# Patient Record
Sex: Female | Born: 1950
Health system: Southern US, Community
[De-identification: ages and names within clinical notes are randomized; demographics above are authoritative.]

## PROBLEM LIST (undated history)

## (undated) DIAGNOSIS — M8580 Other specified disorders of bone density and structure, unspecified site: Secondary | ICD-10-CM

## (undated) HISTORY — DX: Other specified disorders of bone density and structure, unspecified site: M85.80

## (undated) HISTORY — PX: OTHER SURGICAL HISTORY: SHX169

---

## 1997-12-01 ENCOUNTER — Ambulatory Visit (HOSPITAL_COMMUNITY): Admission: RE | Admit: 1997-12-01 | Discharge: 1997-12-01 | Payer: Self-pay | Admitting: Gynecology

## 1998-08-24 ENCOUNTER — Other Ambulatory Visit: Admission: RE | Admit: 1998-08-24 | Discharge: 1998-08-24 | Payer: Self-pay | Admitting: Gynecology

## 1999-10-02 ENCOUNTER — Other Ambulatory Visit: Admission: RE | Admit: 1999-10-02 | Discharge: 1999-10-02 | Payer: Self-pay | Admitting: Gynecology

## 2000-11-27 ENCOUNTER — Other Ambulatory Visit: Admission: RE | Admit: 2000-11-27 | Discharge: 2000-11-27 | Payer: Self-pay | Admitting: Gynecology

## 2002-01-21 ENCOUNTER — Other Ambulatory Visit: Admission: RE | Admit: 2002-01-21 | Discharge: 2002-01-21 | Payer: Self-pay | Admitting: Gynecology

## 2005-03-21 ENCOUNTER — Other Ambulatory Visit: Admission: RE | Admit: 2005-03-21 | Discharge: 2005-03-21 | Payer: Self-pay | Admitting: Gynecology

## 2005-07-08 ENCOUNTER — Emergency Department (HOSPITAL_COMMUNITY): Admission: EM | Admit: 2005-07-08 | Discharge: 2005-07-08 | Payer: Self-pay | Admitting: Family Medicine

## 2006-09-10 ENCOUNTER — Other Ambulatory Visit: Admission: RE | Admit: 2006-09-10 | Discharge: 2006-09-10 | Payer: Self-pay | Admitting: Gynecology

## 2009-02-22 ENCOUNTER — Other Ambulatory Visit: Admission: RE | Admit: 2009-02-22 | Discharge: 2009-02-22 | Payer: Self-pay | Admitting: Gynecology

## 2009-02-22 ENCOUNTER — Encounter: Payer: Self-pay | Admitting: Gynecology

## 2009-02-22 ENCOUNTER — Ambulatory Visit: Payer: Self-pay | Admitting: Gynecology

## 2009-03-01 ENCOUNTER — Ambulatory Visit: Payer: Self-pay | Admitting: Gynecology

## 2009-08-01 ENCOUNTER — Encounter: Admission: RE | Admit: 2009-08-01 | Discharge: 2009-08-01 | Payer: Self-pay | Admitting: Internal Medicine

## 2011-01-22 ENCOUNTER — Other Ambulatory Visit: Payer: Self-pay | Admitting: Gynecology

## 2011-01-22 ENCOUNTER — Other Ambulatory Visit: Payer: Self-pay

## 2011-01-22 DIAGNOSIS — Z1231 Encounter for screening mammogram for malignant neoplasm of breast: Secondary | ICD-10-CM

## 2011-01-24 ENCOUNTER — Other Ambulatory Visit (HOSPITAL_COMMUNITY)
Admission: RE | Admit: 2011-01-24 | Discharge: 2011-01-24 | Disposition: A | Discharge status: Home / Self Care | Payer: BC Managed Care – PPO | Source: Ambulatory Visit | Attending: Gynecology | Admitting: Gynecology

## 2011-01-24 ENCOUNTER — Other Ambulatory Visit: Payer: Self-pay | Admitting: Gynecology

## 2011-01-24 ENCOUNTER — Encounter (INDEPENDENT_AMBULATORY_CARE_PROVIDER_SITE_OTHER): Payer: BC Managed Care – PPO | Admitting: Gynecology

## 2011-01-24 DIAGNOSIS — R823 Hemoglobinuria: Secondary | ICD-10-CM

## 2011-01-24 DIAGNOSIS — Z833 Family history of diabetes mellitus: Secondary | ICD-10-CM

## 2011-01-24 DIAGNOSIS — Z13220 Encounter for screening for lipoid disorders: Secondary | ICD-10-CM

## 2011-01-24 DIAGNOSIS — Z124 Encounter for screening for malignant neoplasm of cervix: Secondary | ICD-10-CM | POA: Insufficient documentation

## 2011-01-24 DIAGNOSIS — Z01419 Encounter for gynecological examination (general) (routine) without abnormal findings: Secondary | ICD-10-CM

## 2011-01-31 ENCOUNTER — Ambulatory Visit
Admission: RE | Admit: 2011-01-31 | Discharge: 2011-01-31 | Disposition: A | Discharge status: Home / Self Care | Payer: BC Managed Care – PPO | Source: Ambulatory Visit | Attending: Gynecology | Admitting: Gynecology

## 2011-01-31 DIAGNOSIS — Z1231 Encounter for screening mammogram for malignant neoplasm of breast: Secondary | ICD-10-CM

## 2011-02-26 ENCOUNTER — Other Ambulatory Visit: Payer: BC Managed Care – PPO

## 2011-06-06 ENCOUNTER — Encounter: Payer: Self-pay | Admitting: Internal Medicine

## 2011-07-26 ENCOUNTER — Other Ambulatory Visit: Payer: BC Managed Care – PPO | Admitting: Internal Medicine

## 2012-01-10 ENCOUNTER — Other Ambulatory Visit: Payer: Self-pay | Admitting: Gastroenterology

## 2012-11-20 ENCOUNTER — Encounter: Payer: Self-pay | Admitting: Gynecology

## 2012-11-20 ENCOUNTER — Ambulatory Visit (INDEPENDENT_AMBULATORY_CARE_PROVIDER_SITE_OTHER): Payer: BC Managed Care – PPO | Admitting: Gynecology

## 2012-11-20 VITALS — BP 124/76 | Ht 65.5 in | Wt 145.0 lb

## 2012-11-20 DIAGNOSIS — Z01419 Encounter for gynecological examination (general) (routine) without abnormal findings: Secondary | ICD-10-CM

## 2012-11-20 DIAGNOSIS — N951 Menopausal and female climacteric states: Secondary | ICD-10-CM

## 2012-11-20 DIAGNOSIS — M949 Disorder of cartilage, unspecified: Secondary | ICD-10-CM

## 2012-11-20 DIAGNOSIS — M899 Disorder of bone, unspecified: Secondary | ICD-10-CM

## 2012-11-20 DIAGNOSIS — M8580 Other specified disorders of bone density and structure, unspecified site: Secondary | ICD-10-CM | POA: Insufficient documentation

## 2012-11-20 DIAGNOSIS — N952 Postmenopausal atrophic vaginitis: Secondary | ICD-10-CM

## 2012-11-20 NOTE — Progress Notes (Signed)
Loretta Cunningham [birth year] [id number]        62 y.o.  [id number] for annual exam.  Doing well.  Past medical history,surgical history, medications, allergies, family history and social history were all reviewed and documented in the EPIC chart. ROS:  Was performed and pertinent positives and negatives are included in the history.  Exam: Kim assistant Filed Vitals:   11/20/12 1442  BP: 124/76  Height: 5' 5.5" (1.664 m)  Weight: 145 lb (65.772 kg)   General appearance  Normal Skin grossly normal Head/Neck normal with no cervical or supraclavicular adenopathy thyroid normal Lungs  clear Cardiac RR, without RMG Abdominal  soft, nontender, without masses, organomegaly or hernia Breasts  examined lying and sitting without masses, retractions, discharge or axillary adenopathy. Pelvic  Ext/BUS/vagina  normal with atrophic changes  Cervix  normal   Uterus  anteverted, normal size, shape and contour, midline and mobile nontender   Adnexa  Without masses or tenderness    Anus and perineum  normal   Rectovaginal  normal sphincter tone without palpated masses or tenderness.    Assessment/Plan:  62 y.o. [id number] female for annual exam.   1. Postmenopausal. Patient is having some hot flashes but not overly bothersome and does not want to consider treatment for them. No bleeding. She does report any bleeding. 2. Osteopenia. DEXA 2010 with T score -1.1. Recommend repeat now and patient will schedule. Increase calcium and vitamin D reviewed. 3. Maternal history of ovarian cancer age 35. No other family history of ovarian breast or other cancers.  Issues of screening to include CA 125 and ultrasound reviewed. False positive false negative issues discussed. patient will think of her options and follow up if she wants to pursue screening.  Given history do not feel genetic testing or other more aggressive approach such as prophylactic salpingo-oophorectomies warranted it at this time. 4. Mammography 2012.  Patient's overdo and she knows this and agrees to schedule. SBE monthly reviewed. 5. Pap smear 2012. No Pap smear done today. No history of abnormal Pap smears. Reviewed current screening guidelines we'll plan repeat next year a 3 year interval. 6. Colonoscopy 2013. 7. Health maintenance. No blood work done today as is all done through her work and she follows up with her primary physician in reference to this. Follow up for DEXA otherwise annually.    Dara Lords MD, 3:22 PM 11/20/2012

## 2012-11-20 NOTE — Patient Instructions (Signed)
Followup for bone density as scheduled. 

## 2012-11-21 ENCOUNTER — Telehealth: Payer: Self-pay | Admitting: *Deleted

## 2012-11-21 LAB — URINALYSIS W MICROSCOPIC + REFLEX CULTURE
Bacteria, UA: NONE SEEN
Casts: NONE SEEN
Crystals: NONE SEEN
Leukocytes, UA: NEGATIVE
Protein, ur: NEGATIVE mg/dL
Squamous Epithelial / LPF: NONE SEEN
Urobilinogen, UA: 0.2 mg/dL (ref 0.0–1.0)
pH: 5.5 (ref 5.0–8.0)

## 2012-11-21 NOTE — Telephone Encounter (Signed)
Left message regarding the below.

## 2012-11-21 NOTE — Telephone Encounter (Signed)
Message copied by Aura Camps on Fri Nov 21, 2012 10:13 AM ------      Message from: Colin Broach P      Created: Thu Nov 20, 2012  5:01 PM       Call patient I saw her today for a exam. She has a history of ovarian cancer in her mother and we talked about doing an ultrasound or CA 125. I did not formalize whether she really wants to schedule this or not. If she wants to schedule the ultrasound go ahead and schedule it, if she decides against this then that's okay.

## 2012-12-02 NOTE — Telephone Encounter (Signed)
Left message for pt to call regarding the below. 

## 2012-12-17 ENCOUNTER — Encounter: Payer: Self-pay | Admitting: *Deleted

## 2012-12-17 NOTE — Telephone Encounter (Signed)
Letter mailed to pt home regarding the below.

## 2013-01-20 ENCOUNTER — Ambulatory Visit (INDEPENDENT_AMBULATORY_CARE_PROVIDER_SITE_OTHER): Payer: BC Managed Care – PPO

## 2013-01-20 DIAGNOSIS — M899 Disorder of bone, unspecified: Secondary | ICD-10-CM

## 2013-01-20 DIAGNOSIS — M8580 Other specified disorders of bone density and structure, unspecified site: Secondary | ICD-10-CM

## 2013-01-21 ENCOUNTER — Encounter: Payer: Self-pay | Admitting: Gynecology

## 2013-09-01 ENCOUNTER — Other Ambulatory Visit: Payer: Self-pay

## 2013-09-01 DIAGNOSIS — Z1231 Encounter for screening mammogram for malignant neoplasm of breast: Secondary | ICD-10-CM

## 2013-09-04 ENCOUNTER — Ambulatory Visit
Admission: RE | Admit: 2013-09-04 | Discharge: 2013-09-04 | Disposition: A | Discharge status: Home / Self Care | Payer: BC Managed Care – PPO | Source: Ambulatory Visit

## 2013-09-04 DIAGNOSIS — Z1231 Encounter for screening mammogram for malignant neoplasm of breast: Secondary | ICD-10-CM

## 2014-08-23 ENCOUNTER — Encounter: Payer: Self-pay | Admitting: Gynecology

## 2015-01-11 ENCOUNTER — Ambulatory Visit (INDEPENDENT_AMBULATORY_CARE_PROVIDER_SITE_OTHER): Payer: BLUE CROSS/BLUE SHIELD | Admitting: Gynecology

## 2015-01-11 ENCOUNTER — Other Ambulatory Visit (HOSPITAL_COMMUNITY)
Admission: RE | Admit: 2015-01-11 | Discharge: 2015-01-11 | Disposition: A | Discharge status: Home / Self Care | Payer: BLUE CROSS/BLUE SHIELD | Source: Ambulatory Visit | Attending: Gynecology | Admitting: Gynecology

## 2015-01-11 ENCOUNTER — Encounter: Payer: Self-pay | Admitting: Gynecology

## 2015-01-11 VITALS — BP 118/80 | Ht 65.0 in | Wt 144.0 lb

## 2015-01-11 DIAGNOSIS — M8580 Other specified disorders of bone density and structure, unspecified site: Secondary | ICD-10-CM

## 2015-01-11 DIAGNOSIS — Z8041 Family history of malignant neoplasm of ovary: Secondary | ICD-10-CM

## 2015-01-11 DIAGNOSIS — Z01419 Encounter for gynecological examination (general) (routine) without abnormal findings: Secondary | ICD-10-CM | POA: Insufficient documentation

## 2015-01-11 MED ORDER — VARENICLINE TARTRATE 1 MG PO TABS: 1.0000 mg | ORAL_TABLET | Freq: Two times a day (BID) | ORAL | Status: DC

## 2015-01-11 MED ORDER — VARENICLINE TARTRATE 0.5 MG PO TABS: 0.5000 mg | ORAL_TABLET | Freq: Every day | ORAL | Status: DC

## 2015-01-11 NOTE — Patient Instructions (Signed)
Follow up for bone density as scheduled.  Follow up for ultrasound as scheduled.  Call to Schedule your mammogram  Facilities in [place]: 1)  [facility], [place] Phone: [phone] 2)  [facility]. [address] [place] Phone: [phone] 3)  Dr. Isaiah Blakes at [facility] [address] Phone: [phone]     Mammogram A mammogram is an X-ray test to find changes in a woman's breast. You should get a mammogram if:  You are 59 years of age or older  You have risk factors.   Your doctor recommends that you have one.  BEFORE THE TEST  Do not schedule the test the week before your period, especially if your breasts are sore during this time.  On the day of your mammogram:  Wash your breasts and armpits well. After washing, do not put on any deodorant or talcum powder on until after your test.   Eat and drink as you usually do.   Take your medicines as usual.   If you are diabetic and take insulin, make sure you:   Eat before coming for your test.   Take your insulin as usual.   If you cannot keep your appointment, call before the appointment to cancel. Schedule another appointment.  TEST  You will need to undress from the waist up. You will put on a hospital gown.   Your breast will be put on the mammogram machine, and it will press firmly on your breast with a piece of plastic called a compression paddle. This will make your breast flatter so that the machine can X-ray all parts of your breast.   Both breasts will be X-rayed. Each breast will be X-rayed from above and from the side. An X-ray might need to be taken again if the picture is not good enough.   The mammogram will last about 15 to 30 minutes.  AFTER THE TEST Finding out the results of your test Ask when your test results will be ready. Make sure you get your test results.  Document Released:  01/04/2009 Document Revised: 09/27/2011 Document Reviewed: 01/04/2009 [facility] Patient Information 2012 [place].  You may obtain a copy of any labs that were done today by logging onto MyChart as outlined in the instructions provided with your AVS (after visit summary). The office will not call with normal lab results but certainly if there are any significant abnormalities then we will contact you.   Health Maintenance, Female A healthy lifestyle and preventative care can promote health and wellness.  Maintain regular health, dental, and eye exams.  Eat a healthy diet. Foods like vegetables, fruits, whole grains, low-fat dairy products, and lean protein foods contain the nutrients you need without too many calories. Decrease your intake of foods high in solid fats, added sugars, and salt. Get information about a proper diet from your caregiver, if necessary.  Regular physical exercise is one of the most important things you can do for your health. Most adults should get at least 150 minutes of moderate-intensity exercise (any activity that increases your heart rate and causes you to sweat) each week. In addition, most adults need muscle-strengthening exercises on 2 or more days a week.   Maintain a healthy weight. The body mass index (BMI) is a screening tool to identify possible weight problems. It provides an estimate of body fat based on height and weight. Your caregiver can help  determine your BMI, and can help you achieve or maintain a healthy weight. For adults 20 years and older:  A BMI below 18.5 is considered underweight.  A BMI of 18.5 to 24.9 is normal.  A BMI of 25 to 29.9 is considered overweight.  A BMI of 30 and above is considered obese.  Maintain normal blood lipids and cholesterol by exercising and minimizing your intake of saturated fat. Eat a balanced diet with plenty of fruits and vegetables. Blood tests for lipids and cholesterol should begin at age 52 and be  repeated every 5 years. If your lipid or cholesterol levels are high, you are over 50, or you are a high risk for heart disease, you may need your cholesterol levels checked more frequently.Ongoing high lipid and cholesterol levels should be treated with medicines if diet and exercise are not effective.  If you smoke, find out from your caregiver how to quit. If you do not use tobacco, do not start.  Lung cancer screening is recommended for adults aged 30 80 years who are at high risk for developing lung cancer because of a history of smoking. Yearly low-dose computed tomography (CT) is recommended for people who have at least a 30-pack-year history of smoking and are a current smoker or have quit within the past 15 years. A pack year of smoking is smoking an average of 1 pack of cigarettes a day for 1 year (for example: 1 pack a day for 30 years or 2 packs a day for 15 years). Yearly screening should continue until the smoker has stopped smoking for at least 15 years. Yearly screening should also be stopped for people who develop a health problem that would prevent them from having lung cancer treatment.  If you are pregnant, do not drink alcohol. If you are breastfeeding, be very cautious about drinking alcohol. If you are not pregnant and choose to drink alcohol, do not exceed 1 drink per day. One drink is considered to be 12 ounces (355 mL) of beer, 5 ounces (148 mL) of wine, or 1.5 ounces (44 mL) of liquor.  Avoid use of street drugs. Do not share needles with anyone. Ask for help if you need support or instructions about stopping the use of drugs.  High blood pressure causes heart disease and increases the risk of stroke. Blood pressure should be checked at least every 1 to 2 years. Ongoing high blood pressure should be treated with medicines, if weight loss and exercise are not effective.  If you are 23 to 63 years old, ask your caregiver if you should take aspirin to prevent strokes.  Diabetes  screening involves taking a blood sample to check your fasting blood sugar level. This should be done once every 3 years, after age 40, if you are within normal weight and without risk factors for diabetes. Testing should be considered at a younger age or be carried out more frequently if you are overweight and have at least 1 risk factor for diabetes.  Breast cancer screening is essential preventative care for women. You should practice "breast self-awareness." This means understanding the normal appearance and feel of your breasts and may include breast self-examination. Any changes detected, no matter how small, should be reported to a caregiver. Women in their 36s and 30s should have a clinical breast exam (CBE) by a caregiver as part of a regular health exam every 1 to 3 years. After age 49, women should have a CBE every year. Starting at age  4, women should consider having a mammogram (breast X-ray) every year. Women who have a family history of breast cancer should talk to their caregiver about genetic screening. Women at a high risk of breast cancer should talk to their caregiver about having an MRI and a mammogram every year.  Breast cancer gene (BRCA)-related cancer risk assessment is recommended for women who have family members with BRCA-related cancers. BRCA-related cancers include breast, ovarian, tubal, and peritoneal cancers. Having family members with these cancers may be associated with an increased risk for harmful changes (mutations) in the breast cancer genes BRCA1 and BRCA2. Results of the assessment will determine the need for genetic counseling and BRCA1 and BRCA2 testing.  The Pap test is a screening test for cervical cancer. Women should have a Pap test starting at age 59. Between ages 52 and 60, Pap tests should be repeated every 2 years. Beginning at age 17, you should have a Pap test every 3 years as long as the past 3 Pap tests have been normal. If you had a hysterectomy for a  problem that was not cancer or a condition that could lead to cancer, then you no longer need Pap tests. If you are between ages 42 and 36, and you have had normal Pap tests going back 10 years, you no longer need Pap tests. If you have had past treatment for cervical cancer or a condition that could lead to cancer, you need Pap tests and screening for cancer for at least 20 years after your treatment. If Pap tests have been discontinued, risk factors (such as a new sexual partner) need to be reassessed to determine if screening should be resumed. Some women have medical problems that increase the chance of getting cervical cancer. In these cases, your caregiver may recommend more frequent screening and Pap tests.  The human papillomavirus (HPV) test is an additional test that may be used for cervical cancer screening. The HPV test looks for the virus that can cause the cell changes on the cervix. The cells collected during the Pap test can be tested for HPV. The HPV test could be used to screen women aged 44 years and older, and should be used in women of any age who have unclear Pap test results. After the age of 41, women should have HPV testing at the same frequency as a Pap test.  Colorectal cancer can be detected and often prevented. Most routine colorectal cancer screening begins at the age of 35 and continues through age 35. However, your caregiver may recommend screening at an earlier age if you have risk factors for colon cancer. On a yearly basis, your caregiver may provide home test kits to check for hidden blood in the stool. Use of a small camera at the end of a tube, to directly examine the colon (sigmoidoscopy or colonoscopy), can detect the earliest forms of colorectal cancer. Talk to your caregiver about this at age 54, when routine screening begins. Direct examination of the colon should be repeated every 5 to 10 years through age 32, unless early forms of pre-cancerous polyps or small growths  are found.  Hepatitis C blood testing is recommended for all people born from [birth year] through 1965 and any individual with known risks for hepatitis C.  Practice safe sex. Use condoms and avoid high-risk sexual practices to reduce the spread of sexually transmitted infections (STIs). Sexually active women aged 2 and younger should be checked for Chlamydia, which is a common sexually transmitted infection.  Older women with new or multiple partners should also be tested for Chlamydia. Testing for other STIs is recommended if you are sexually active and at increased risk.  Osteoporosis is a disease in which the bones lose minerals and strength with aging. This can result in serious bone fractures. The risk of osteoporosis can be identified using a bone density scan. Women ages 75 and over and women at risk for fractures or osteoporosis should discuss screening with their caregivers. Ask your caregiver whether you should be taking a calcium supplement or vitamin D to reduce the rate of osteoporosis.  Menopause can be associated with physical symptoms and risks. Hormone replacement therapy is available to decrease symptoms and risks. You should talk to your caregiver about whether hormone replacement therapy is right for you.  Use sunscreen. Apply sunscreen liberally and repeatedly throughout the day. You should seek shade when your shadow is shorter than you. Protect yourself by wearing long sleeves, pants, a wide-brimmed hat, and sunglasses year round, whenever you are outdoors.  Notify your caregiver of new moles or changes in moles, especially if there is a change in shape or color. Also notify your caregiver if a mole is larger than the size of a pencil eraser.  Stay current with your immunizations. Document Released: 04/23/2011 Document Revised: 02/02/2013 Document Reviewed: 04/23/2011 [facility] Patient Information 2014 [place].

## 2015-01-11 NOTE — Progress Notes (Signed)
Loretta Cunningham [birth year] [id number]        64 y.o.  [id number] for annual exam.  Several issues noted below.  Past medical history,surgical history, problem list, medications, allergies, family history and social history were all reviewed and documented as reviewed in the EPIC chart.  ROS:  Performed with pertinent positives and negatives included in the history, assessment and plan.   Additional significant findings :  none   Exam: Loretta ServeKim Cunningham assistant Filed Vitals:   01/11/15 1554  BP: 118/80  Height: 5\' 5"  (1.651 m)  Weight: 144 lb (65.318 kg)   General appearance:  Normal affect, orientation and appearance. Skin: Grossly normal HEENT: Without gross lesions.  No cervical or supraclavicular adenopathy. Thyroid normal.  Lungs:  Clear without wheezing, rales or rhonchi Cardiac: RR, without RMG Abdominal:  Soft, nontender, without masses, guarding, rebound, organomegaly or hernia Breasts:  Examined lying and sitting without masses, retractions, discharge or axillary adenopathy. Pelvic:  Ext/BUS/vagina with atrophic changes  Cervix with atrophic changes  Uterus anteverted, normal size, shape and contour, midline and mobile nontender   Adnexa  Without masses or tenderness    Anus and perineum  Normal   Rectovaginal  Normal sphincter tone without palpated masses or tenderness.    Assessment/Plan:  64 y.o. [id number] female for annual exam.   1. Postmenopausal/atrophic genital changes. Patient without significant symptoms of hot flushes, night sweats, vaginal dryness or any vaginal bleeding.  Continue to monitor and report any vaginal bleeding. 2. Osteopenia. DEXA 01/2013 T score -1.7 FRAX 16%/0.9%. Repeat DEXA this spring at two-year interval. Patient agrees to schedule. Increased calcium vitamin D reviewed. 3. Maternal history of ovarian cancer age 170. No other family history of ovarian breast or other cancers. I again reviewed options to include screening's ultrasound/CA 125, genetic  screening, genetic counselor referral, prophylactic salpingo-oophorectomy. Patient would like ultrasound at this point for surveillance. Understands the false positive and false negative issues with this. We'll follow up after ultrasound to further discuss. 4. Pap smear 2012. Pap smear done today. No history of significant abnormal Pap smears. 5. Mammography 01/2013. Patient knows she is overdue and agrees to schedule. SBE monthly reviewed. 6. Colonoscopy 2013. Repeat at their recommended interval. 7. Stop smoking. Patient requests Chantix. Had used before with good results. Prescription provided for starter 0.5 mg taper up and 1 mg twice a day dosage. Up to 3 month refill provided. Side effect and risks reviewed up to and including suicide ideation, dreaming and GI. Patient will call if she has any issues. 8. Health maintenance. 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 Follow up for ultrasound/bone density otherwise 1 year for annual exam.     Dara LordsFONTAINE,Jathen Sudano P MD, 4:49 PM 01/11/2015

## 2015-01-12 LAB — URINALYSIS W MICROSCOPIC + REFLEX CULTURE
BACTERIA UA: NONE SEEN
Bilirubin Urine: NEGATIVE
CASTS: NONE SEEN
CRYSTALS: NONE SEEN
GLUCOSE, UA: NEGATIVE mg/dL
Hgb urine dipstick: NEGATIVE
Ketones, ur: NEGATIVE mg/dL
Nitrite: NEGATIVE
PH: 7 (ref 5.0–8.0)
PROTEIN: NEGATIVE mg/dL
SPECIFIC GRAVITY, URINE: 1.023 (ref 1.005–1.030)
UROBILINOGEN UA: 1 mg/dL (ref 0.0–1.0)

## 2015-01-13 LAB — URINE CULTURE: Colony Count: 30000

## 2015-01-13 LAB — CYTOLOGY - PAP

## 2015-01-21 DIAGNOSIS — M8580 Other specified disorders of bone density and structure, unspecified site: Secondary | ICD-10-CM

## 2015-01-21 HISTORY — DX: Other specified disorders of bone density and structure, unspecified site: M85.80

## 2015-01-26 ENCOUNTER — Encounter: Payer: Self-pay | Admitting: Gynecology

## 2015-01-26 ENCOUNTER — Other Ambulatory Visit: Payer: Self-pay | Admitting: Gynecology

## 2015-01-26 ENCOUNTER — Ambulatory Visit (INDEPENDENT_AMBULATORY_CARE_PROVIDER_SITE_OTHER): Payer: BLUE CROSS/BLUE SHIELD | Admitting: Gynecology

## 2015-01-26 ENCOUNTER — Ambulatory Visit (INDEPENDENT_AMBULATORY_CARE_PROVIDER_SITE_OTHER): Payer: BLUE CROSS/BLUE SHIELD

## 2015-01-26 VITALS — BP 122/78

## 2015-01-26 DIAGNOSIS — N858 Other specified noninflammatory disorders of uterus: Secondary | ICD-10-CM

## 2015-01-26 DIAGNOSIS — Z8041 Family history of malignant neoplasm of ovary: Secondary | ICD-10-CM

## 2015-01-26 DIAGNOSIS — N83319 Acquired atrophy of ovary, unspecified side: Secondary | ICD-10-CM

## 2015-01-26 DIAGNOSIS — N8331 Acquired atrophy of ovary: Secondary | ICD-10-CM

## 2015-01-26 NOTE — Progress Notes (Signed)
Isaias CowmanJane L Jozwiak 11-Jun-1951 [id number]        64 y.o.  [id number] presents for ultrasound due to maternal history of ovarian cancer in her 7870s. No other family member. She does relate that her sister's daughter was genetically tested and negative.  Past medical history,surgical history, problem list, medications, allergies, family history and social history were all reviewed and documented in the EPIC chart.  Directed ROS with pertinent positives and negatives documented in the history of present illness/assessment and plan.  Exam:  Filed Vitals:   01/26/15 1615  BP: 122/78   Ultrasound shows uterus normal size. Endometrial echo 2.0 mm. Right and left ovaries normal/atrophic. Cul-de-sac negative.  Assessment/Plan:  [age] y.o. J4N8295G3P2012 with above history. Ultrasound is negative. Patient has bone density scheduled tomorrow and will follow up for this. Otherwise will follow up in one year for annual exam. I again reviewed with her that if she wants to have her ovaries/fallopian tubes removed that we can do this laparoscopically but I cannot guarantee that insurance will cover this for the history of mother with ovarian cancer in her 3370s and no other family members of breast or ovarian cancer. Patient declines further evaluation or surgery at this time.    Dara LordsFONTAINE,Marlis Oldaker P MD, 4:55 PM 01/26/2015

## 2015-01-26 NOTE — Patient Instructions (Addendum)
Follow up for scheduled bone density.

## 2015-01-27 ENCOUNTER — Encounter: Payer: Self-pay | Admitting: Gynecology

## 2015-01-27 ENCOUNTER — Ambulatory Visit (INDEPENDENT_AMBULATORY_CARE_PROVIDER_SITE_OTHER): Payer: BLUE CROSS/BLUE SHIELD

## 2015-01-27 ENCOUNTER — Other Ambulatory Visit: Payer: Self-pay | Admitting: Gynecology

## 2015-01-27 DIAGNOSIS — Z13820 Encounter for screening for osteoporosis: Secondary | ICD-10-CM | POA: Diagnosis not present

## 2015-01-27 DIAGNOSIS — M8580 Other specified disorders of bone density and structure, unspecified site: Secondary | ICD-10-CM

## 2015-07-05 ENCOUNTER — Other Ambulatory Visit: Payer: Self-pay

## 2015-07-05 DIAGNOSIS — Z1231 Encounter for screening mammogram for malignant neoplasm of breast: Secondary | ICD-10-CM

## 2015-07-08 ENCOUNTER — Ambulatory Visit
Admission: RE | Admit: 2015-07-08 | Discharge: 2015-07-08 | Disposition: A | Discharge status: Home / Self Care | Payer: BLUE CROSS/BLUE SHIELD | Source: Ambulatory Visit

## 2015-07-08 DIAGNOSIS — Z1231 Encounter for screening mammogram for malignant neoplasm of breast: Secondary | ICD-10-CM

## 2016-03-21 DIAGNOSIS — Z008 Encounter for other general examination: Secondary | ICD-10-CM | POA: Diagnosis not present

## 2016-03-21 DIAGNOSIS — Z720 Tobacco use: Secondary | ICD-10-CM | POA: Diagnosis not present

## 2016-10-08 DIAGNOSIS — Z008 Encounter for other general examination: Secondary | ICD-10-CM | POA: Diagnosis not present

## 2016-10-08 DIAGNOSIS — F17200 Nicotine dependence, unspecified, uncomplicated: Secondary | ICD-10-CM | POA: Diagnosis not present

## 2016-11-20 ENCOUNTER — Other Ambulatory Visit: Payer: Self-pay | Admitting: Internal Medicine

## 2016-11-20 DIAGNOSIS — Z1231 Encounter for screening mammogram for malignant neoplasm of breast: Secondary | ICD-10-CM

## 2016-12-18 ENCOUNTER — Ambulatory Visit
Admission: RE | Admit: 2016-12-18 | Discharge: 2016-12-18 | Disposition: A | Discharge status: Home / Self Care | Payer: BLUE CROSS/BLUE SHIELD | Source: Ambulatory Visit | Attending: Internal Medicine | Admitting: Internal Medicine

## 2016-12-18 DIAGNOSIS — Z1231 Encounter for screening mammogram for malignant neoplasm of breast: Secondary | ICD-10-CM | POA: Diagnosis not present

## 2016-12-20 ENCOUNTER — Other Ambulatory Visit: Payer: Self-pay | Admitting: Internal Medicine

## 2016-12-20 DIAGNOSIS — R928 Other abnormal and inconclusive findings on diagnostic imaging of breast: Secondary | ICD-10-CM

## 2016-12-24 ENCOUNTER — Other Ambulatory Visit: Payer: BLUE CROSS/BLUE SHIELD

## 2016-12-25 ENCOUNTER — Other Ambulatory Visit: Payer: Self-pay | Admitting: Gynecology

## 2016-12-25 DIAGNOSIS — R928 Other abnormal and inconclusive findings on diagnostic imaging of breast: Secondary | ICD-10-CM

## 2016-12-28 ENCOUNTER — Ambulatory Visit
Admission: RE | Admit: 2016-12-28 | Discharge: 2016-12-28 | Disposition: A | Discharge status: Home / Self Care | Payer: BLUE CROSS/BLUE SHIELD | Source: Ambulatory Visit | Attending: Gynecology | Admitting: Gynecology

## 2016-12-28 DIAGNOSIS — R928 Other abnormal and inconclusive findings on diagnostic imaging of breast: Secondary | ICD-10-CM | POA: Diagnosis not present

## 2016-12-28 DIAGNOSIS — N6002 Solitary cyst of left breast: Secondary | ICD-10-CM | POA: Diagnosis not present

## 2017-01-14 DIAGNOSIS — Z720 Tobacco use: Secondary | ICD-10-CM | POA: Diagnosis not present

## 2017-01-14 DIAGNOSIS — J301 Allergic rhinitis due to pollen: Secondary | ICD-10-CM | POA: Diagnosis not present

## 2017-01-16 ENCOUNTER — Ambulatory Visit (INDEPENDENT_AMBULATORY_CARE_PROVIDER_SITE_OTHER): Payer: BLUE CROSS/BLUE SHIELD | Admitting: Gynecology

## 2017-01-16 ENCOUNTER — Encounter: Payer: Self-pay | Admitting: Gynecology

## 2017-01-16 VITALS — BP 106/70 | Ht 64.75 in | Wt 148.0 lb

## 2017-01-16 DIAGNOSIS — M8580 Other specified disorders of bone density and structure, unspecified site: Secondary | ICD-10-CM | POA: Diagnosis not present

## 2017-01-16 DIAGNOSIS — Z8041 Family history of malignant neoplasm of ovary: Secondary | ICD-10-CM

## 2017-01-16 DIAGNOSIS — N952 Postmenopausal atrophic vaginitis: Secondary | ICD-10-CM | POA: Diagnosis not present

## 2017-01-16 DIAGNOSIS — Z01411 Encounter for gynecological examination (general) (routine) with abnormal findings: Secondary | ICD-10-CM

## 2017-01-16 NOTE — Patient Instructions (Signed)
Office will call you to arrange surgery. 

## 2017-01-16 NOTE — Progress Notes (Signed)
    Loretta CowmanJane L Cunningham 02/11/1951 [id number]        66 y.o.  [id number] for annual exam.  Patient also has questions about her family history of ovarian cancer.  Past medical history,surgical history, problem list, medications, allergies, family history and social history were all reviewed and documented as reviewed in the EPIC chart.  ROS:  Performed with pertinent positives and negatives included in the history, assessment and plan.   Additional significant findings :  None   Exam: Biomedical scientistBlanca assistant Vitals:   01/16/17 1504  BP: 106/70  Weight: 148 lb (67.1 kg)  Height: 5' 4.75" (1.645 m)   Body mass index is 24.82 kg/m.  General appearance:  Normal affect, orientation and appearance. Skin: Grossly normal HEENT: Without gross lesions.  No cervical or supraclavicular adenopathy. Thyroid normal.  Lungs:  Clear without wheezing, rales or rhonchi Cardiac: RR, without RMG Abdominal:  Soft, nontender, without masses, guarding, rebound, organomegaly or hernia Breasts:  Examined lying and sitting without masses, retractions, discharge or axillary adenopathy. Pelvic:  Ext, BUS, Vagina: With atrophic changes  Cervix: With atrophic changes  Uterus: Anteverted, normal size, shape and contour, midline and mobile nontender   Adnexa: Without masses or tenderness    Anus and perineum: Normal   Rectovaginal: Normal sphincter tone without palpated masses or tenderness.    Assessment/Plan:  66 y.o. [id number] female for annual exam.   1. Postmenopausal/atrophic genital changes. No significant hot flushes, night sweats, vaginal dryness or any vaginal bleeding. Continue to monitor and report any issues or bleeding. 2. Osteopenia. DEXA 2016 T score -1.8 FRAX 18%/2%. Stable from prior DEXA. Plan repeat DEXA in another year or 2. Increase calcium vitamin D. 3. History of mother with ovarian cancer at age 570. No other family members. Sister had prophylactic salpingo-oophorectomy. Neice had genetic testing  which was negative.  Patient is having increasing anxiety about risks of ovarian cancer having lived through her mother's disease. We have discussed in the past genetic counseling our testing options, serial monitoring with ultrasounds and CA-125 monitoring recognizing the false positive and false negative nature of this, prophylactic surgery to include salpingo-oophorectomy and no intervention with observation all reviewed. Patient finds himself thinking about this and worried about this time at this point would like to proceed with prophylactic laparoscopic salpingo-oophorectomy. She understands if she was genetically positive this would also carry substantial risks as far as breast cancer and again the options for screening offered but at this point the patient declines genetic screening. She would like to proceed with surgery. I discussed the general risks to include anesthetic risks and the potential for exploratory laparoscopy with larger incision and longer recovery. Will plan on ultrasound and CA-125 preoperatively and she'll follow up for full preoperative consult. 4. Pap smear 2016. No Pap smear done today. No history of significant abnormal Pap smears. Plan repeat Pap smear next year at 3 year interval per current screening guidelines. 5. Colonoscopy 5 years ago historically. Plan repeat at their recommended interval. 6. Mammography 2018. Continue with annual mammography when due. SBE monthly reviewed. 7. Health maintenance. No routine lab work done as patient reports is done elsewhere. Follow up preoperatively for surgery and for ultrasound/CA-125. Follow up for annual exam 1 year, sooner as needed.   Dara LordsFONTAINE,Alquan Morrish P MD, 5:08 PM 01/16/2017

## 2017-01-17 ENCOUNTER — Telehealth: Payer: Self-pay

## 2017-01-17 NOTE — Telephone Encounter (Signed)
I called patient to discuss scheduled Lap BSO. We discussed ins benefits and her estimated surgery prepymt.  She said she does not feel there is any rush to this and April, May are busy months for her. She is considering June 26th and I will hold time for her until I return from vacation after next week. She will call me the 4/10 to let me know her decision as she wants to consider the date.

## 2017-04-29 DIAGNOSIS — Z720 Tobacco use: Secondary | ICD-10-CM | POA: Diagnosis not present

## 2017-04-29 DIAGNOSIS — J301 Allergic rhinitis due to pollen: Secondary | ICD-10-CM | POA: Diagnosis not present

## 2017-05-01 DIAGNOSIS — K5730 Diverticulosis of large intestine without perforation or abscess without bleeding: Secondary | ICD-10-CM | POA: Diagnosis not present

## 2017-05-01 DIAGNOSIS — Z86010 Personal history of colonic polyps: Secondary | ICD-10-CM | POA: Diagnosis not present

## 2017-05-01 DIAGNOSIS — K635 Polyp of colon: Secondary | ICD-10-CM | POA: Diagnosis not present

## 2017-05-22 DIAGNOSIS — L237 Allergic contact dermatitis due to plants, except food: Secondary | ICD-10-CM | POA: Diagnosis not present

## 2017-10-09 DIAGNOSIS — H02423 Myogenic ptosis of bilateral eyelids: Secondary | ICD-10-CM | POA: Diagnosis not present

## 2018-01-06 DIAGNOSIS — Z720 Tobacco use: Secondary | ICD-10-CM | POA: Diagnosis not present

## 2018-01-06 DIAGNOSIS — J301 Allergic rhinitis due to pollen: Secondary | ICD-10-CM | POA: Diagnosis not present

## 2018-01-13 DIAGNOSIS — L2389 Allergic contact dermatitis due to other agents: Secondary | ICD-10-CM | POA: Diagnosis not present

## 2018-02-20 ENCOUNTER — Encounter: Payer: Self-pay | Admitting: Gynecology

## 2018-02-20 ENCOUNTER — Telehealth: Payer: Self-pay | Admitting: *Deleted

## 2018-02-20 ENCOUNTER — Ambulatory Visit (INDEPENDENT_AMBULATORY_CARE_PROVIDER_SITE_OTHER): Payer: BLUE CROSS/BLUE SHIELD | Admitting: Gynecology

## 2018-02-20 VITALS — BP 110/70 | Ht 64.5 in | Wt 144.0 lb

## 2018-02-20 DIAGNOSIS — Z8041 Family history of malignant neoplasm of ovary: Secondary | ICD-10-CM

## 2018-02-20 DIAGNOSIS — M8580 Other specified disorders of bone density and structure, unspecified site: Secondary | ICD-10-CM | POA: Diagnosis not present

## 2018-02-20 DIAGNOSIS — Z01419 Encounter for gynecological examination (general) (routine) without abnormal findings: Secondary | ICD-10-CM

## 2018-02-20 DIAGNOSIS — N952 Postmenopausal atrophic vaginitis: Secondary | ICD-10-CM

## 2018-02-20 NOTE — Telephone Encounter (Signed)
-----   Message from Dara Lords, MD sent at 02/20/2018  9:03 AM EDT ----- Schedule appointment for genetic counseling reference mother history of ovarian cancer

## 2018-02-20 NOTE — Addendum Note (Signed)
Addended by: Dayna Barker on: 02/20/2018 09:20 AM   Modules accepted: Orders

## 2018-02-20 NOTE — Patient Instructions (Addendum)
Follow up for the bone density as scheduled  Genetic counseling office will call to schedule appointmnet

## 2018-02-20 NOTE — Telephone Encounter (Signed)
Referral placed at [facility] cancer center, they will call patient to schedule.

## 2018-02-20 NOTE — Progress Notes (Signed)
    Loretta Cunningham [birth year] [id number]        67 y.o.  [id number] for annual gynecologic exam.  Without gynecologic complaints  Past medical history,surgical history, problem list, medications, allergies, family history and social history were all reviewed and documented as reviewed in the EPIC chart.  ROS:  Performed with pertinent positives and negatives included in the history, assessment and plan.   Additional significant findings : None   Exam: Kennon Portela assistant Vitals:   02/20/18 0822  BP: 110/70  Weight: 144 lb (65.3 kg)  Height: 5' 4.5" (1.638 m)   Body mass index is 24.34 kg/m.  General appearance:  Normal affect, orientation and appearance. Skin: Grossly normal HEENT: Without gross lesions.  No cervical or supraclavicular adenopathy. Thyroid normal.  Lungs:  Clear without wheezing, rales or rhonchi Cardiac: RR, without RMG Abdominal:  Soft, nontender, without masses, guarding, rebound, organomegaly or hernia Breasts:  Examined lying and sitting without masses, retractions, discharge or axillary adenopathy. Pelvic:  Ext, BUS, Vagina: With atrophic changes  Cervix: With atrophic changes.  Pap smear done  Uterus: Anteverted, normal size, shape and contour, midline and mobile nontender   Adnexa: Without masses or tenderness    Anus and perineum: Normal   Rectovaginal: Normal sphincter tone without palpated masses or tenderness.    Assessment/Plan:  66 y.o. [id number] female for annual gynecologic exam.   1. Postmenopausal/atrophic genital changes.  No significant hot flushes, night sweats, vaginal dryness or any vaginal bleeding. 2. Osteopenia.  DEXA 2016 T score -1.8 FRAX 18% / 2%.  Recommend follow-up DEXA now and patient is going to schedule and follow-up for this. 3. Mother with ovarian cancer at age 30.  We discussed this last year (see 01/16/2017 office note) and she was going to follow-up for prophylactic salpingo-oophorectomy but did not do this.  I again  reviewed the issues of genetic linkage and risks.  Options are do nothing, screening with ultrasounds and CA 125's recognizing the false negative issues with this and false positive issues, genetic counseling with possible genetic testing, proceeding with prophylactic salpingo-oophorectomy regardless.  The pros and cons of each choice reviewed.  Patient agrees to genetic counseling and will go ahead and arrange this.  She knows to expect a call from their office to schedule the appointment. 4. Pap smear 2016.  Pap smear done today.  No history of abnormal Pap smears. 5. Colonoscopy 2018.  Repeat at their recommended interval. 6. Mammography due now and patient reminded to schedule.  Breast exam normal today. 7. Health maintenance.  No routine lab work done as patient does this elsewhere.  Follow-up for bone density and genetic counseling as scheduled.  Follow-up for annual exam in 1 year   Dara Lords MD, 9:03 AM 02/20/2018

## 2018-02-24 LAB — PAP IG W/ RFLX HPV ASCU

## 2018-02-27 NOTE — Telephone Encounter (Signed)
Cancer center left message x 1

## 2018-03-04 NOTE — Telephone Encounter (Signed)
Encounter will be closed since cancer center has left message for pt to call and she is aware of referral

## 2018-03-05 ENCOUNTER — Ambulatory Visit (INDEPENDENT_AMBULATORY_CARE_PROVIDER_SITE_OTHER): Payer: BLUE CROSS/BLUE SHIELD

## 2018-03-05 ENCOUNTER — Other Ambulatory Visit: Payer: Self-pay | Admitting: Gynecology

## 2018-03-05 DIAGNOSIS — M8589 Other specified disorders of bone density and structure, multiple sites: Secondary | ICD-10-CM

## 2018-03-05 DIAGNOSIS — Z13820 Encounter for screening for osteoporosis: Secondary | ICD-10-CM | POA: Diagnosis not present

## 2018-03-05 DIAGNOSIS — Z780 Asymptomatic menopausal state: Secondary | ICD-10-CM

## 2018-03-05 DIAGNOSIS — M8580 Other specified disorders of bone density and structure, unspecified site: Secondary | ICD-10-CM

## 2018-03-06 ENCOUNTER — Encounter: Payer: Self-pay | Admitting: Gynecology

## 2018-04-21 ENCOUNTER — Encounter: Payer: BLUE CROSS/BLUE SHIELD | Admitting: Gynecology

## 2018-05-26 DIAGNOSIS — L259 Unspecified contact dermatitis, unspecified cause: Secondary | ICD-10-CM | POA: Diagnosis not present

## 2018-07-30 ENCOUNTER — Other Ambulatory Visit: Payer: Self-pay | Admitting: Gynecology

## 2018-07-30 DIAGNOSIS — Z1231 Encounter for screening mammogram for malignant neoplasm of breast: Secondary | ICD-10-CM

## 2018-08-25 DIAGNOSIS — Z008 Encounter for other general examination: Secondary | ICD-10-CM | POA: Diagnosis not present

## 2018-08-28 ENCOUNTER — Ambulatory Visit
Admission: RE | Admit: 2018-08-28 | Discharge: 2018-08-28 | Disposition: A | Discharge status: Home / Self Care | Payer: BLUE CROSS/BLUE SHIELD | Source: Ambulatory Visit | Attending: Gynecology | Admitting: Gynecology

## 2018-08-28 DIAGNOSIS — Z1231 Encounter for screening mammogram for malignant neoplasm of breast: Secondary | ICD-10-CM

## 2019-03-30 DIAGNOSIS — D485 Neoplasm of uncertain behavior of skin: Secondary | ICD-10-CM | POA: Diagnosis not present

## 2019-03-30 DIAGNOSIS — L720 Epidermal cyst: Secondary | ICD-10-CM | POA: Diagnosis not present

## 2019-03-30 DIAGNOSIS — L821 Other seborrheic keratosis: Secondary | ICD-10-CM | POA: Diagnosis not present

## 2019-03-30 DIAGNOSIS — D1801 Hemangioma of skin and subcutaneous tissue: Secondary | ICD-10-CM | POA: Diagnosis not present

## 2019-03-30 DIAGNOSIS — D2261 Melanocytic nevi of right upper limb, including shoulder: Secondary | ICD-10-CM | POA: Diagnosis not present

## 2019-03-30 DIAGNOSIS — D2262 Melanocytic nevi of left upper limb, including shoulder: Secondary | ICD-10-CM | POA: Diagnosis not present

## 2019-06-15 DIAGNOSIS — K635 Polyp of colon: Secondary | ICD-10-CM | POA: Diagnosis not present

## 2019-06-15 DIAGNOSIS — Z720 Tobacco use: Secondary | ICD-10-CM | POA: Diagnosis not present

## 2019-06-15 DIAGNOSIS — R42 Dizziness and giddiness: Secondary | ICD-10-CM | POA: Diagnosis not present

## 2019-07-02 DIAGNOSIS — M6740 Ganglion, unspecified site: Secondary | ICD-10-CM | POA: Diagnosis not present

## 2019-07-21 DIAGNOSIS — Z1331 Encounter for screening for depression: Secondary | ICD-10-CM | POA: Diagnosis not present

## 2019-07-21 DIAGNOSIS — H8110 Benign paroxysmal vertigo, unspecified ear: Secondary | ICD-10-CM | POA: Diagnosis not present

## 2019-07-21 DIAGNOSIS — F17200 Nicotine dependence, unspecified, uncomplicated: Secondary | ICD-10-CM | POA: Diagnosis not present

## 2019-07-29 ENCOUNTER — Encounter: Payer: Self-pay | Admitting: Gynecology

## 2019-08-03 DIAGNOSIS — Z23 Encounter for immunization: Secondary | ICD-10-CM | POA: Diagnosis not present

## 2019-09-01 ENCOUNTER — Other Ambulatory Visit: Payer: Self-pay

## 2019-09-02 ENCOUNTER — Ambulatory Visit (INDEPENDENT_AMBULATORY_CARE_PROVIDER_SITE_OTHER): Payer: BC Managed Care – PPO | Admitting: Gynecology

## 2019-09-02 ENCOUNTER — Encounter: Payer: BLUE CROSS/BLUE SHIELD | Admitting: Gynecology

## 2019-09-02 ENCOUNTER — Encounter: Payer: Self-pay | Admitting: Gynecology

## 2019-09-02 VITALS — BP 122/74 | Ht 65.0 in | Wt 140.0 lb

## 2019-09-02 DIAGNOSIS — N952 Postmenopausal atrophic vaginitis: Secondary | ICD-10-CM | POA: Diagnosis not present

## 2019-09-02 DIAGNOSIS — M8580 Other specified disorders of bone density and structure, unspecified site: Secondary | ICD-10-CM

## 2019-09-02 DIAGNOSIS — Z01419 Encounter for gynecological examination (general) (routine) without abnormal findings: Secondary | ICD-10-CM

## 2019-09-02 NOTE — Progress Notes (Signed)
    Loretta Cunningham Sep 12, 1951 [id number]        68 y.o.  [id number] for annual gynecologic exam.  Without gynecologic complaints  Past medical history,surgical history, problem list, medications, allergies, family history and social history were all reviewed and documented as reviewed in the EPIC chart.  ROS:  Performed with pertinent positives and negatives included in the history, assessment and plan.   Additional significant findings : None   Exam: Caryn Bee assistant Vitals:   09/02/19 1151  BP: 122/74  Weight: 140 lb (63.5 kg)  Height: 5\' 5"  (1.651 m)   Body mass index is 23.3 kg/m.  General appearance:  Normal affect, orientation and appearance. Skin: Grossly normal HEENT: Without gross lesions.  No cervical or supraclavicular adenopathy. Thyroid normal.  Lungs:  Clear without wheezing, rales or rhonchi Cardiac: RR, without RMG Abdominal:  Soft, nontender, without masses, guarding, rebound, organomegaly or hernia Breasts:  Examined lying and sitting without masses, retractions, discharge or axillary adenopathy. Pelvic:  Ext, BUS, Vagina: Normal with atrophic changes  Cervix: Normal with atrophic changes  Uterus: Anteverted, normal size, shape and contour, midline and mobile nontender   Adnexa: Without masses or tenderness    Anus and perineum: Normal   Rectovaginal: Normal sphincter tone without palpated masses or tenderness.    Assessment/Plan:  68 y.o. [id number] female for annual gynecologic exam.   1. Postmenopausal.  No significant menopausal symptoms or any vaginal bleeding. 2. Osteopenia.  DEXA 2019 T score -1.8 FRAX 18% / 1.8%.  Stable from prior DEXA.  Recommend follow-up DEXA in another year or 2. 3. Colonoscopy 2018.  Repeat at their recommended interval. 4. Mammography is due and she is in the process of arranging.  Breast exam normal today. 5. Pap smear 2019.  No Pap smear done today.  No history of abnormal Pap smears previously.  Options to stop screening  per current screening guidelines reviewed.  Will readdress on an annual basis. 60. Mother with ovarian cancer age 51.  We have discussed on multiple occasions options for management to include genetic counseling for which the patient had been referred last year but ultimately declined.  Prophylactic BSO, ultrasound and CA 125 screening all reviewed.  At this point the patient declines all of this and prefers expectant management accepting the  risks of ovarian cancer. 7. Health maintenance.  No routine lab work done as patient reports that done elsewhere.  Follow-up 1 year, sooner as needed.   Anastasio Auerbach MD, 12:18 PM 09/02/2019

## 2019-09-02 NOTE — Patient Instructions (Signed)
Follow-up in 1 year for annual exam, sooner as needed. 

## 2019-09-11 DIAGNOSIS — D7589 Other specified diseases of blood and blood-forming organs: Secondary | ICD-10-CM | POA: Diagnosis not present

## 2019-09-22 DIAGNOSIS — D7589 Other specified diseases of blood and blood-forming organs: Secondary | ICD-10-CM | POA: Diagnosis not present

## 2019-09-22 DIAGNOSIS — M859 Disorder of bone density and structure, unspecified: Secondary | ICD-10-CM | POA: Diagnosis not present

## 2019-09-22 DIAGNOSIS — Z0000 Encounter for general adult medical examination without abnormal findings: Secondary | ICD-10-CM | POA: Diagnosis not present

## 2019-09-22 DIAGNOSIS — H8110 Benign paroxysmal vertigo, unspecified ear: Secondary | ICD-10-CM | POA: Diagnosis not present

## 2019-09-22 DIAGNOSIS — F17200 Nicotine dependence, unspecified, uncomplicated: Secondary | ICD-10-CM | POA: Diagnosis not present

## 2019-09-24 ENCOUNTER — Other Ambulatory Visit: Payer: Self-pay | Admitting: Internal Medicine

## 2019-09-24 DIAGNOSIS — Z0000 Encounter for general adult medical examination without abnormal findings: Secondary | ICD-10-CM

## 2019-09-24 DIAGNOSIS — F17209 Nicotine dependence, unspecified, with unspecified nicotine-induced disorders: Secondary | ICD-10-CM

## 2019-10-20 ENCOUNTER — Ambulatory Visit: Payer: BC Managed Care – PPO | Attending: Internal Medicine

## 2019-10-20 DIAGNOSIS — U071 COVID-19: Secondary | ICD-10-CM

## 2019-10-20 DIAGNOSIS — R238 Other skin changes: Secondary | ICD-10-CM

## 2019-10-21 LAB — NOVEL CORONAVIRUS, NAA: SARS-CoV-2, NAA: NOT DETECTED

## 2019-11-06 DIAGNOSIS — H00015 Hordeolum externum left lower eyelid: Secondary | ICD-10-CM | POA: Diagnosis not present

## 2019-11-12 ENCOUNTER — Other Ambulatory Visit: Payer: Self-pay | Admitting: Internal Medicine

## 2019-11-12 DIAGNOSIS — Z1231 Encounter for screening mammogram for malignant neoplasm of breast: Secondary | ICD-10-CM

## 2019-12-17 ENCOUNTER — Other Ambulatory Visit: Payer: Self-pay

## 2019-12-17 ENCOUNTER — Ambulatory Visit
Admission: RE | Admit: 2019-12-17 | Discharge: 2019-12-17 | Disposition: A | Discharge status: Home / Self Care | Payer: BC Managed Care – PPO | Source: Ambulatory Visit | Attending: Internal Medicine | Admitting: Internal Medicine

## 2019-12-17 DIAGNOSIS — Z1231 Encounter for screening mammogram for malignant neoplasm of breast: Secondary | ICD-10-CM | POA: Diagnosis not present

## 2020-02-23 DIAGNOSIS — M8589 Other specified disorders of bone density and structure, multiple sites: Secondary | ICD-10-CM | POA: Diagnosis not present

## 2020-03-22 DIAGNOSIS — Z23 Encounter for immunization: Secondary | ICD-10-CM | POA: Diagnosis not present

## 2020-03-22 DIAGNOSIS — D7589 Other specified diseases of blood and blood-forming organs: Secondary | ICD-10-CM | POA: Diagnosis not present

## 2020-03-22 DIAGNOSIS — M859 Disorder of bone density and structure, unspecified: Secondary | ICD-10-CM | POA: Diagnosis not present

## 2020-03-22 DIAGNOSIS — Z1389 Encounter for screening for other disorder: Secondary | ICD-10-CM | POA: Diagnosis not present

## 2020-03-22 DIAGNOSIS — Z8041 Family history of malignant neoplasm of ovary: Secondary | ICD-10-CM | POA: Diagnosis not present

## 2020-03-29 ENCOUNTER — Other Ambulatory Visit: Payer: Self-pay | Admitting: Internal Medicine

## 2020-03-29 DIAGNOSIS — Z0000 Encounter for general adult medical examination without abnormal findings: Secondary | ICD-10-CM

## 2020-03-30 DIAGNOSIS — M859 Disorder of bone density and structure, unspecified: Secondary | ICD-10-CM | POA: Diagnosis not present

## 2020-03-30 DIAGNOSIS — D7589 Other specified diseases of blood and blood-forming organs: Secondary | ICD-10-CM | POA: Diagnosis not present

## 2020-03-30 DIAGNOSIS — L659 Nonscarring hair loss, unspecified: Secondary | ICD-10-CM | POA: Diagnosis not present

## 2020-04-13 ENCOUNTER — Ambulatory Visit
Admission: RE | Admit: 2020-04-13 | Discharge: 2020-04-13 | Disposition: A | Discharge status: Home / Self Care | Payer: BC Managed Care – PPO | Source: Ambulatory Visit | Attending: Internal Medicine | Admitting: Internal Medicine

## 2020-04-13 DIAGNOSIS — Z0000 Encounter for general adult medical examination without abnormal findings: Secondary | ICD-10-CM

## 2020-04-13 DIAGNOSIS — F17210 Nicotine dependence, cigarettes, uncomplicated: Secondary | ICD-10-CM | POA: Diagnosis not present

## 2020-04-29 ENCOUNTER — Other Ambulatory Visit: Payer: Self-pay | Admitting: Internal Medicine

## 2020-04-29 DIAGNOSIS — R911 Solitary pulmonary nodule: Secondary | ICD-10-CM

## 2020-09-19 DIAGNOSIS — M8580 Other specified disorders of bone density and structure, unspecified site: Secondary | ICD-10-CM | POA: Diagnosis not present

## 2020-12-12 DIAGNOSIS — D7589 Other specified diseases of blood and blood-forming organs: Secondary | ICD-10-CM | POA: Diagnosis not present

## 2020-12-12 DIAGNOSIS — M859 Disorder of bone density and structure, unspecified: Secondary | ICD-10-CM | POA: Diagnosis not present

## 2020-12-12 DIAGNOSIS — R7989 Other specified abnormal findings of blood chemistry: Secondary | ICD-10-CM | POA: Diagnosis not present

## 2020-12-19 DIAGNOSIS — Z1212 Encounter for screening for malignant neoplasm of rectum: Secondary | ICD-10-CM | POA: Diagnosis not present

## 2020-12-19 DIAGNOSIS — Z0000 Encounter for general adult medical examination without abnormal findings: Secondary | ICD-10-CM | POA: Diagnosis not present

## 2020-12-19 DIAGNOSIS — R82998 Other abnormal findings in urine: Secondary | ICD-10-CM | POA: Diagnosis not present

## 2020-12-19 DIAGNOSIS — Z23 Encounter for immunization: Secondary | ICD-10-CM | POA: Diagnosis not present

## 2020-12-19 DIAGNOSIS — D7589 Other specified diseases of blood and blood-forming organs: Secondary | ICD-10-CM | POA: Diagnosis not present

## 2021-01-05 DIAGNOSIS — R3129 Other microscopic hematuria: Secondary | ICD-10-CM | POA: Diagnosis not present

## 2021-01-05 DIAGNOSIS — N390 Urinary tract infection, site not specified: Secondary | ICD-10-CM | POA: Diagnosis not present

## 2021-01-31 DIAGNOSIS — R12 Heartburn: Secondary | ICD-10-CM | POA: Diagnosis not present

## 2021-02-07 ENCOUNTER — Ambulatory Visit: Payer: BC Managed Care – PPO | Admitting: Family Medicine

## 2021-02-07 ENCOUNTER — Ambulatory Visit: Payer: Self-pay

## 2021-02-07 ENCOUNTER — Other Ambulatory Visit: Payer: Self-pay

## 2021-02-07 VITALS — BP 102/68 | HR 75 | Ht 65.0 in | Wt 145.2 lb

## 2021-02-07 DIAGNOSIS — M25511 Pain in right shoulder: Secondary | ICD-10-CM

## 2021-02-07 NOTE — Patient Instructions (Addendum)
Thank you for coming in today.  Please perform the exercise program that we have prepared for you and gone over in detail on a daily basis.  In addition to the handout you were provided you can access your program through: [email or web address]   Your unique program code is:  [facility]

## 2021-02-07 NOTE — Progress Notes (Signed)
I, Philbert Riser, LAT, ATC acting as a scribe for Clementeen Graham, MD.  Subjective:    I'm seeing this patient as a consultation for Dr. Charlane Ferretti. Note will be routed back to referring provider/PCP.  CC: Right shoulder pain  HPI: Pt is a 70 y/o female c/o R shoulder pain ongoing for 6 weeks. Pt reports she previously had a RC tear in R shoulder 8+ years ago and was treated and pain/injury resolved. MOI: After traveling to [place], carrying luggage, putting bag in the overhead bin etc she experienced severe pain that evening. Pt reports pain has pretty much resolved at this point. Pt locates pain to anterior aspect of the shoulder and upper arm. Pt is R hand dominate. Pt notes that the pain feels like a "nerve" pain.  Overall the pain is improving.  She is not interested in injections today more interested at home exercises or physical therapy to get this problem better.  Radiates: no UE Numbness/tingling: no UE Weakness: yes Aggravates: horizontal ABD, and overhead motions Treatments tried: ice, Tylenol, IBU, rest  Past medical history, Surgical history, Family history, Social history, Allergies, and medications have been entered into the medical record, reviewed. History prior rotator cuff injury right shoulder  Review of Systems: No new headache, visual changes, nausea, vomiting, diarrhea, constipation, dizziness, abdominal pain, skin rash, fevers, chills, night sweats, weight loss, swollen lymph nodes, body aches, joint swelling, muscle aches, chest pain, shortness of breath, mood changes, visual or auditory hallucinations.   Objective:    Vitals:   02/07/21 1534  BP: 102/68  Pulse: 75  SpO2: 98%   General: Well Developed, well nourished, and in no acute distress.  Neuro/Psych: Alert and oriented x3, extra-ocular muscles intact, able to move all 4 extremities, sensation grossly intact. Skin: Warm and dry, no rashes noted.  Respiratory: Not using accessory muscles,  speaking in full sentences, trachea midline.  Cardiovascular: Pulses palpable, no extremity edema. Abdomen: Does not appear distended. MSK: C-spine normal-appearing nontender normal cervical motion. Right shoulder normal-appearing nontender. Normal motion some pain with abduction. Strength intact abduction however there is a bit of pain. External rotation and internal rotation strength is intact. Mildly positive Hawkins and Neer's test. Positive empty can test. Negative Yergason's and speeds test. Pulses capillary refill and sensation are intact distally.  Lab and Radiology Results  Diagnostic Limited MSK Ultrasound of: Right shoulder Biceps tendon intact normal-appearing Subscapularis tendon is intact. Supraspinatus tendon slight hypoechoic change bursal surface represents likely old partial rotator cuff injury versus subacute partial rotator cuff tear.  No significant retraction or increased Doppler activity Mild subacromial bursitis present. Infraspinatus tendon is intact. AC joint degenerative visit reviewed Impression: Subacromial bursitis.  Either old supraspinatus injury versus subacute supraspinatus injury.  No evidence of full-thickness retracted rotator cuff tear.   Impression and Recommendations:    Assessment and Plan: 70 y.o. female with right shoulder pain.  Fortunately Oda is clinically doing very well.  She has excellent strength and her pain is improving.  She has evidence of subacromial bursitis and impingement and on ultrasound either has evidence of an old rotator cuff injury or a subacute rotator cuff injury to the supraspinatus tendon.  She should do well with conservative management.  Plan for home exercise teaching provided in clinic today by ATC.  If not improving in a few weeks patient will notify me and I will refer to formal physical therapy.  (Likely [facility]).   Recheck back in about 6 weeks.  PDMP not reviewed this encounter. Orders Placed This  Encounter  Procedures  . [place] LIMITED JOINT SPACE STRUCTURES UP RIGHT(NO LINKED CHARGES)    Standing Status:   Future    Number of Occurrences:   1    Standing Expiration Date:   08/09/2021    Order Specific Question:   Reason for Exam (SYMPTOM  OR DIAGNOSIS REQUIRED)    Answer:   right shoulder pain    Order Specific Question:   Preferred imaging location?    Answer:   [facility]   No orders of the defined types were placed in this encounter.   Discussed warning signs or symptoms. Please see discharge instructions. Patient expresses understanding.   The above documentation has been reviewed and is accurate and complete Clementeen Graham, M.D.

## 2021-03-29 DIAGNOSIS — R3121 Asymptomatic microscopic hematuria: Secondary | ICD-10-CM | POA: Diagnosis not present

## 2021-04-13 DIAGNOSIS — R3121 Asymptomatic microscopic hematuria: Secondary | ICD-10-CM | POA: Diagnosis not present

## 2021-04-21 DIAGNOSIS — R3121 Asymptomatic microscopic hematuria: Secondary | ICD-10-CM | POA: Diagnosis not present

## 2021-06-16 ENCOUNTER — Other Ambulatory Visit: Payer: Self-pay | Admitting: Internal Medicine

## 2021-06-16 DIAGNOSIS — Z1231 Encounter for screening mammogram for malignant neoplasm of breast: Secondary | ICD-10-CM

## 2021-06-21 ENCOUNTER — Other Ambulatory Visit: Payer: Self-pay

## 2021-06-21 ENCOUNTER — Ambulatory Visit
Admission: RE | Admit: 2021-06-21 | Discharge: 2021-06-21 | Disposition: A | Discharge status: Home / Self Care | Payer: BC Managed Care – PPO | Source: Ambulatory Visit | Attending: Internal Medicine | Admitting: Internal Medicine

## 2021-06-21 DIAGNOSIS — Z1231 Encounter for screening mammogram for malignant neoplasm of breast: Secondary | ICD-10-CM

## 2021-07-31 DIAGNOSIS — Z23 Encounter for immunization: Secondary | ICD-10-CM | POA: Diagnosis not present

## 2021-08-15 DIAGNOSIS — Z7189 Other specified counseling: Secondary | ICD-10-CM | POA: Diagnosis not present

## 2021-08-15 DIAGNOSIS — R12 Heartburn: Secondary | ICD-10-CM | POA: Diagnosis not present

## 2021-08-23 ENCOUNTER — Other Ambulatory Visit: Payer: Self-pay | Admitting: Internal Medicine

## 2021-08-23 DIAGNOSIS — R918 Other nonspecific abnormal finding of lung field: Secondary | ICD-10-CM

## 2021-09-07 ENCOUNTER — Other Ambulatory Visit: Payer: Self-pay | Admitting: Internal Medicine

## 2021-09-07 ENCOUNTER — Ambulatory Visit
Admission: RE | Admit: 2021-09-07 | Discharge: 2021-09-07 | Disposition: A | Discharge status: Home / Self Care | Payer: BC Managed Care – PPO | Source: Ambulatory Visit | Attending: Internal Medicine | Admitting: Internal Medicine

## 2021-09-07 DIAGNOSIS — Z87891 Personal history of nicotine dependence: Secondary | ICD-10-CM | POA: Diagnosis not present

## 2021-09-07 DIAGNOSIS — R918 Other nonspecific abnormal finding of lung field: Secondary | ICD-10-CM

## 2021-09-28 ENCOUNTER — Other Ambulatory Visit: Payer: Self-pay | Admitting: Internal Medicine

## 2021-09-28 DIAGNOSIS — R911 Solitary pulmonary nodule: Secondary | ICD-10-CM

## 2021-11-02 DIAGNOSIS — R3121 Asymptomatic microscopic hematuria: Secondary | ICD-10-CM | POA: Diagnosis not present

## 2022-02-22 DIAGNOSIS — D123 Benign neoplasm of transverse colon: Secondary | ICD-10-CM | POA: Diagnosis not present

## 2022-02-22 DIAGNOSIS — K5730 Diverticulosis of large intestine without perforation or abscess without bleeding: Secondary | ICD-10-CM | POA: Diagnosis not present

## 2022-02-22 DIAGNOSIS — K649 Unspecified hemorrhoids: Secondary | ICD-10-CM | POA: Diagnosis not present

## 2022-02-22 DIAGNOSIS — D128 Benign neoplasm of rectum: Secondary | ICD-10-CM | POA: Diagnosis not present

## 2022-02-22 DIAGNOSIS — Z86010 Personal history of colonic polyps: Secondary | ICD-10-CM | POA: Diagnosis not present

## 2022-03-02 ENCOUNTER — Other Ambulatory Visit: Payer: Self-pay | Admitting: Internal Medicine

## 2022-03-02 ENCOUNTER — Ambulatory Visit
Admission: RE | Admit: 2022-03-02 | Discharge: 2022-03-02 | Disposition: A | Discharge status: Home / Self Care | Payer: BC Managed Care – PPO | Source: Ambulatory Visit | Attending: Internal Medicine | Admitting: Internal Medicine

## 2022-03-02 DIAGNOSIS — R911 Solitary pulmonary nodule: Secondary | ICD-10-CM

## 2022-03-02 DIAGNOSIS — R918 Other nonspecific abnormal finding of lung field: Secondary | ICD-10-CM | POA: Diagnosis not present

## 2022-03-02 DIAGNOSIS — J929 Pleural plaque without asbestos: Secondary | ICD-10-CM | POA: Diagnosis not present

## 2022-03-02 DIAGNOSIS — J984 Other disorders of lung: Secondary | ICD-10-CM | POA: Diagnosis not present

## 2022-03-02 DIAGNOSIS — M47814 Spondylosis without myelopathy or radiculopathy, thoracic region: Secondary | ICD-10-CM | POA: Diagnosis not present

## 2022-03-12 DIAGNOSIS — R7989 Other specified abnormal findings of blood chemistry: Secondary | ICD-10-CM | POA: Diagnosis not present

## 2022-03-12 DIAGNOSIS — M8580 Other specified disorders of bone density and structure, unspecified site: Secondary | ICD-10-CM | POA: Diagnosis not present

## 2022-03-12 DIAGNOSIS — D7589 Other specified diseases of blood and blood-forming organs: Secondary | ICD-10-CM | POA: Diagnosis not present

## 2022-03-13 DIAGNOSIS — H57813 Brow ptosis, bilateral: Secondary | ICD-10-CM | POA: Diagnosis not present

## 2022-03-23 DIAGNOSIS — Z1339 Encounter for screening examination for other mental health and behavioral disorders: Secondary | ICD-10-CM | POA: Diagnosis not present

## 2022-03-23 DIAGNOSIS — M8580 Other specified disorders of bone density and structure, unspecified site: Secondary | ICD-10-CM | POA: Diagnosis not present

## 2022-03-23 DIAGNOSIS — Z1331 Encounter for screening for depression: Secondary | ICD-10-CM | POA: Diagnosis not present

## 2022-03-23 DIAGNOSIS — Z0000 Encounter for general adult medical examination without abnormal findings: Secondary | ICD-10-CM | POA: Diagnosis not present

## 2022-05-16 DIAGNOSIS — H02132 Senile ectropion of right lower eyelid: Secondary | ICD-10-CM | POA: Diagnosis not present

## 2022-05-16 DIAGNOSIS — H02135 Senile ectropion of left lower eyelid: Secondary | ICD-10-CM | POA: Diagnosis not present

## 2022-09-06 DIAGNOSIS — H02831 Dermatochalasis of right upper eyelid: Secondary | ICD-10-CM | POA: Diagnosis not present

## 2022-09-06 DIAGNOSIS — H02051 Trichiasis without entropian right upper eyelid: Secondary | ICD-10-CM | POA: Diagnosis not present

## 2022-09-06 DIAGNOSIS — H02055 Trichiasis without entropian left lower eyelid: Secondary | ICD-10-CM | POA: Diagnosis not present

## 2022-09-06 DIAGNOSIS — H02052 Trichiasis without entropian right lower eyelid: Secondary | ICD-10-CM | POA: Diagnosis not present

## 2022-09-06 DIAGNOSIS — H02054 Trichiasis without entropian left upper eyelid: Secondary | ICD-10-CM | POA: Diagnosis not present

## 2022-09-06 DIAGNOSIS — H02834 Dermatochalasis of left upper eyelid: Secondary | ICD-10-CM | POA: Diagnosis not present

## 2022-09-25 DIAGNOSIS — H8110 Benign paroxysmal vertigo, unspecified ear: Secondary | ICD-10-CM | POA: Diagnosis not present

## 2022-09-25 DIAGNOSIS — R918 Other nonspecific abnormal finding of lung field: Secondary | ICD-10-CM | POA: Diagnosis not present

## 2022-09-25 DIAGNOSIS — M8580 Other specified disorders of bone density and structure, unspecified site: Secondary | ICD-10-CM | POA: Diagnosis not present

## 2022-09-25 DIAGNOSIS — I700 Atherosclerosis of aorta: Secondary | ICD-10-CM | POA: Diagnosis not present

## 2022-09-25 DIAGNOSIS — M2550 Pain in unspecified joint: Secondary | ICD-10-CM | POA: Diagnosis not present

## 2022-09-25 DIAGNOSIS — J439 Emphysema, unspecified: Secondary | ICD-10-CM | POA: Diagnosis not present

## 2022-09-25 DIAGNOSIS — D7589 Other specified diseases of blood and blood-forming organs: Secondary | ICD-10-CM | POA: Diagnosis not present

## 2022-09-25 DIAGNOSIS — M25511 Pain in right shoulder: Secondary | ICD-10-CM | POA: Diagnosis not present

## 2022-09-25 DIAGNOSIS — R12 Heartburn: Secondary | ICD-10-CM | POA: Diagnosis not present

## 2022-09-25 DIAGNOSIS — E785 Hyperlipidemia, unspecified: Secondary | ICD-10-CM | POA: Diagnosis not present

## 2022-09-25 DIAGNOSIS — F17200 Nicotine dependence, unspecified, uncomplicated: Secondary | ICD-10-CM | POA: Diagnosis not present

## 2022-10-24 DIAGNOSIS — Z6824 Body mass index (BMI) 24.0-24.9, adult: Secondary | ICD-10-CM | POA: Diagnosis not present

## 2022-10-24 DIAGNOSIS — R5383 Other fatigue: Secondary | ICD-10-CM | POA: Diagnosis not present

## 2022-10-24 DIAGNOSIS — M79642 Pain in left hand: Secondary | ICD-10-CM | POA: Diagnosis not present

## 2022-10-24 DIAGNOSIS — M1991 Primary osteoarthritis, unspecified site: Secondary | ICD-10-CM | POA: Diagnosis not present

## 2022-10-24 DIAGNOSIS — M79641 Pain in right hand: Secondary | ICD-10-CM | POA: Diagnosis not present

## 2022-10-24 DIAGNOSIS — M059 Rheumatoid arthritis with rheumatoid factor, unspecified: Secondary | ICD-10-CM | POA: Diagnosis not present

## 2022-10-24 DIAGNOSIS — M25511 Pain in right shoulder: Secondary | ICD-10-CM | POA: Diagnosis not present

## 2022-10-24 DIAGNOSIS — M25512 Pain in left shoulder: Secondary | ICD-10-CM | POA: Diagnosis not present

## 2022-10-29 DIAGNOSIS — L821 Other seborrheic keratosis: Secondary | ICD-10-CM | POA: Diagnosis not present

## 2022-11-13 DIAGNOSIS — M059 Rheumatoid arthritis with rheumatoid factor, unspecified: Secondary | ICD-10-CM | POA: Diagnosis not present

## 2022-11-13 DIAGNOSIS — M79642 Pain in left hand: Secondary | ICD-10-CM | POA: Diagnosis not present

## 2022-11-13 DIAGNOSIS — H02055 Trichiasis without entropian left lower eyelid: Secondary | ICD-10-CM | POA: Diagnosis not present

## 2022-11-13 DIAGNOSIS — M1991 Primary osteoarthritis, unspecified site: Secondary | ICD-10-CM | POA: Diagnosis not present

## 2022-11-13 DIAGNOSIS — M79641 Pain in right hand: Secondary | ICD-10-CM | POA: Diagnosis not present

## 2022-11-13 DIAGNOSIS — Z6823 Body mass index (BMI) 23.0-23.9, adult: Secondary | ICD-10-CM | POA: Diagnosis not present

## 2022-11-13 DIAGNOSIS — H02052 Trichiasis without entropian right lower eyelid: Secondary | ICD-10-CM | POA: Diagnosis not present

## 2022-12-24 ENCOUNTER — Other Ambulatory Visit: Payer: Self-pay | Admitting: Internal Medicine

## 2022-12-24 DIAGNOSIS — Z1231 Encounter for screening mammogram for malignant neoplasm of breast: Secondary | ICD-10-CM

## 2023-01-09 DIAGNOSIS — M1991 Primary osteoarthritis, unspecified site: Secondary | ICD-10-CM | POA: Diagnosis not present

## 2023-01-09 DIAGNOSIS — M25512 Pain in left shoulder: Secondary | ICD-10-CM | POA: Diagnosis not present

## 2023-01-09 DIAGNOSIS — M79642 Pain in left hand: Secondary | ICD-10-CM | POA: Diagnosis not present

## 2023-01-09 DIAGNOSIS — M059 Rheumatoid arthritis with rheumatoid factor, unspecified: Secondary | ICD-10-CM | POA: Diagnosis not present

## 2023-01-09 DIAGNOSIS — Z6823 Body mass index (BMI) 23.0-23.9, adult: Secondary | ICD-10-CM | POA: Diagnosis not present

## 2023-01-09 DIAGNOSIS — M79641 Pain in right hand: Secondary | ICD-10-CM | POA: Diagnosis not present

## 2023-02-05 ENCOUNTER — Ambulatory Visit
Admission: RE | Admit: 2023-02-05 | Discharge: 2023-02-05 | Disposition: A | Discharge status: Home / Self Care | Payer: PPO | Source: Ambulatory Visit | Attending: Internal Medicine | Admitting: Internal Medicine

## 2023-02-05 DIAGNOSIS — Z1231 Encounter for screening mammogram for malignant neoplasm of breast: Secondary | ICD-10-CM | POA: Diagnosis not present

## 2023-02-06 DIAGNOSIS — M059 Rheumatoid arthritis with rheumatoid factor, unspecified: Secondary | ICD-10-CM | POA: Diagnosis not present

## 2023-02-07 ENCOUNTER — Other Ambulatory Visit: Payer: Self-pay | Admitting: Internal Medicine

## 2023-02-07 DIAGNOSIS — R928 Other abnormal and inconclusive findings on diagnostic imaging of breast: Secondary | ICD-10-CM

## 2023-02-20 ENCOUNTER — Ambulatory Visit
Admission: RE | Admit: 2023-02-20 | Discharge: 2023-02-20 | Disposition: A | Discharge status: Home / Self Care | Payer: PPO | Source: Ambulatory Visit | Attending: Internal Medicine | Admitting: Internal Medicine

## 2023-02-20 ENCOUNTER — Ambulatory Visit: Payer: PPO

## 2023-02-20 DIAGNOSIS — R928 Other abnormal and inconclusive findings on diagnostic imaging of breast: Secondary | ICD-10-CM

## 2023-02-20 DIAGNOSIS — R922 Inconclusive mammogram: Secondary | ICD-10-CM | POA: Diagnosis not present

## 2023-03-13 DIAGNOSIS — Z79899 Other long term (current) drug therapy: Secondary | ICD-10-CM | POA: Diagnosis not present

## 2023-03-13 DIAGNOSIS — M79641 Pain in right hand: Secondary | ICD-10-CM | POA: Diagnosis not present

## 2023-03-13 DIAGNOSIS — E785 Hyperlipidemia, unspecified: Secondary | ICD-10-CM | POA: Diagnosis not present

## 2023-03-13 DIAGNOSIS — R7989 Other specified abnormal findings of blood chemistry: Secondary | ICD-10-CM | POA: Diagnosis not present

## 2023-03-13 DIAGNOSIS — Z6822 Body mass index (BMI) 22.0-22.9, adult: Secondary | ICD-10-CM | POA: Diagnosis not present

## 2023-03-13 DIAGNOSIS — M79642 Pain in left hand: Secondary | ICD-10-CM | POA: Diagnosis not present

## 2023-03-13 DIAGNOSIS — M8580 Other specified disorders of bone density and structure, unspecified site: Secondary | ICD-10-CM | POA: Diagnosis not present

## 2023-03-13 DIAGNOSIS — Z0000 Encounter for general adult medical examination without abnormal findings: Secondary | ICD-10-CM | POA: Diagnosis not present

## 2023-03-13 DIAGNOSIS — M059 Rheumatoid arthritis with rheumatoid factor, unspecified: Secondary | ICD-10-CM | POA: Diagnosis not present

## 2023-03-13 DIAGNOSIS — M1991 Primary osteoarthritis, unspecified site: Secondary | ICD-10-CM | POA: Diagnosis not present

## 2023-03-26 DIAGNOSIS — Z1339 Encounter for screening examination for other mental health and behavioral disorders: Secondary | ICD-10-CM | POA: Diagnosis not present

## 2023-03-26 DIAGNOSIS — M2550 Pain in unspecified joint: Secondary | ICD-10-CM | POA: Diagnosis not present

## 2023-03-26 DIAGNOSIS — R12 Heartburn: Secondary | ICD-10-CM | POA: Diagnosis not present

## 2023-03-26 DIAGNOSIS — D7589 Other specified diseases of blood and blood-forming organs: Secondary | ICD-10-CM | POA: Diagnosis not present

## 2023-03-26 DIAGNOSIS — M8580 Other specified disorders of bone density and structure, unspecified site: Secondary | ICD-10-CM | POA: Diagnosis not present

## 2023-03-26 DIAGNOSIS — Z1331 Encounter for screening for depression: Secondary | ICD-10-CM | POA: Diagnosis not present

## 2023-03-26 DIAGNOSIS — Z0000 Encounter for general adult medical examination without abnormal findings: Secondary | ICD-10-CM | POA: Diagnosis not present

## 2023-03-26 DIAGNOSIS — E785 Hyperlipidemia, unspecified: Secondary | ICD-10-CM | POA: Diagnosis not present

## 2023-03-26 DIAGNOSIS — M059 Rheumatoid arthritis with rheumatoid factor, unspecified: Secondary | ICD-10-CM | POA: Diagnosis not present

## 2023-03-26 DIAGNOSIS — R82998 Other abnormal findings in urine: Secondary | ICD-10-CM | POA: Diagnosis not present

## 2023-03-26 DIAGNOSIS — Z8041 Family history of malignant neoplasm of ovary: Secondary | ICD-10-CM | POA: Diagnosis not present

## 2023-03-26 DIAGNOSIS — I700 Atherosclerosis of aorta: Secondary | ICD-10-CM | POA: Diagnosis not present

## 2023-05-15 IMAGING — CT CT CHEST LUNG CANCER SCREENING LOW DOSE W/O CM
2 of 5 series · 15 of 40 positions shown, 18 images · non-contrast
Comparison: 04/13/2020 screening chest CT.

CLINICAL DATA: 70-year-old asymptomatic female former smoker with
30 pack-year smoking history, quit smoking 1 year prior.

EXAM:
CT CHEST WITHOUT CONTRAST LOW-DOSE FOR LUNG CANCER SCREENING
TECHNIQUE: Multidetector CT imaging of the chest was performed following the
standard protocol without IV contrast.

[Series 4: lung 1.00 br44 cor · coronal · 0.72mm/px · 3 of 378 slices shown]
[im 76/378  lung]
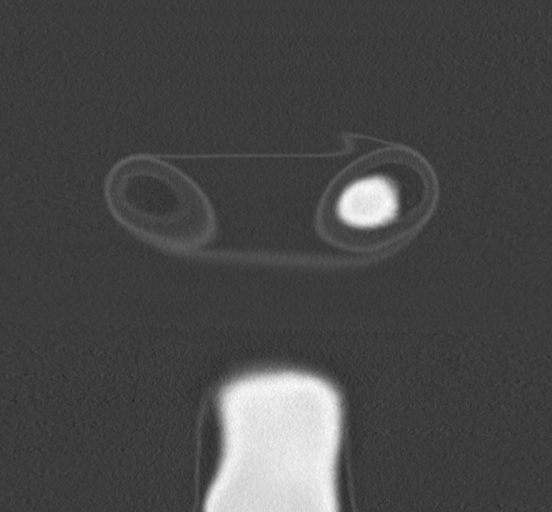
[im 151/378  lung]
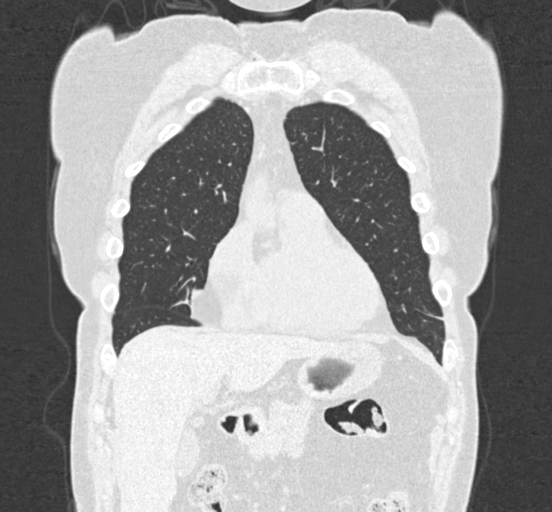
[im 227/378  lung]
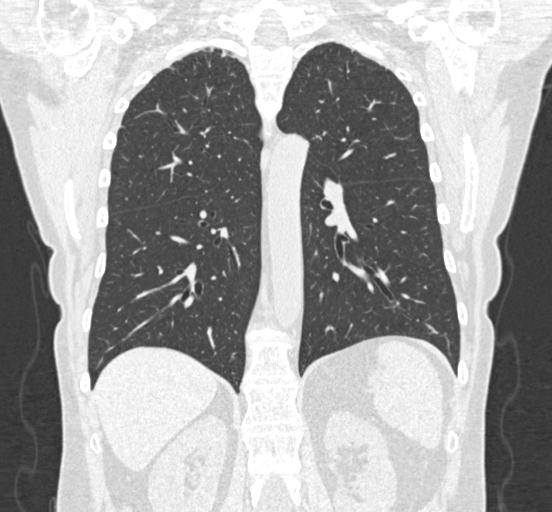

[Series 9: lung 1.00 br60 axial · axial · 0.78mm/px · z∈[-1221,-888]mm · 12 of 369 slices shown, 15 images]
[im 18/369  mediastinal]
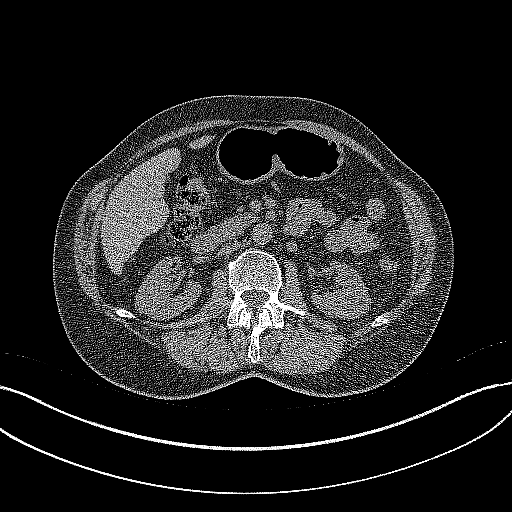
[im 18/369  lung]
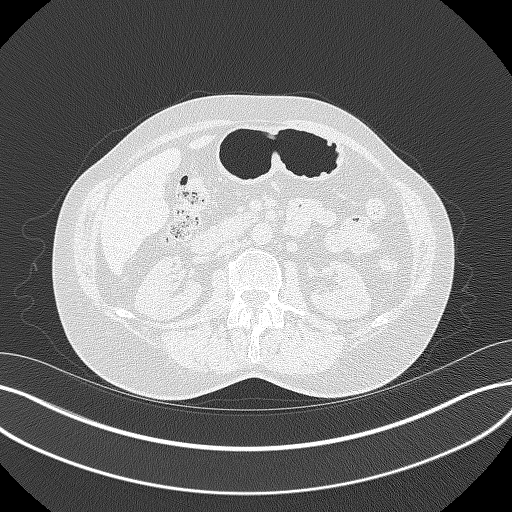
[im 53/369  lung]
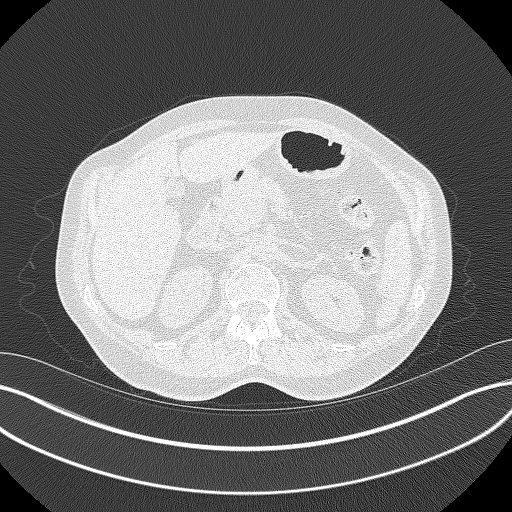
[im 88/369  lung]
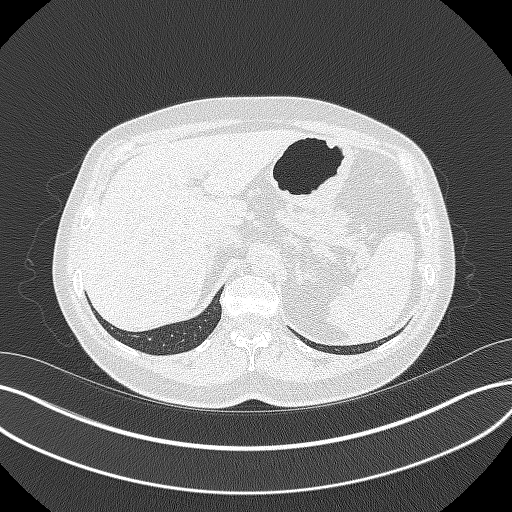
[im 106/369  lung]
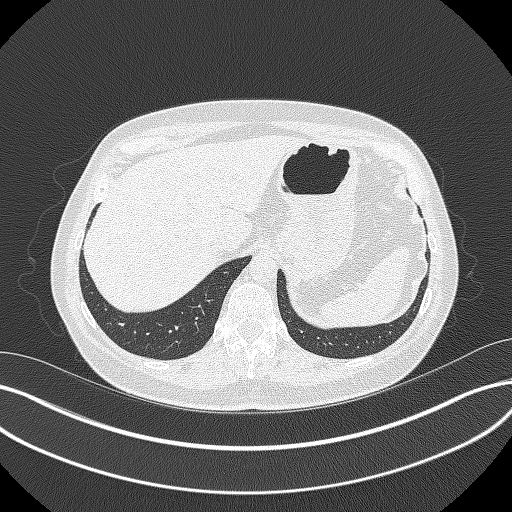
[im 141/369  mediastinal]
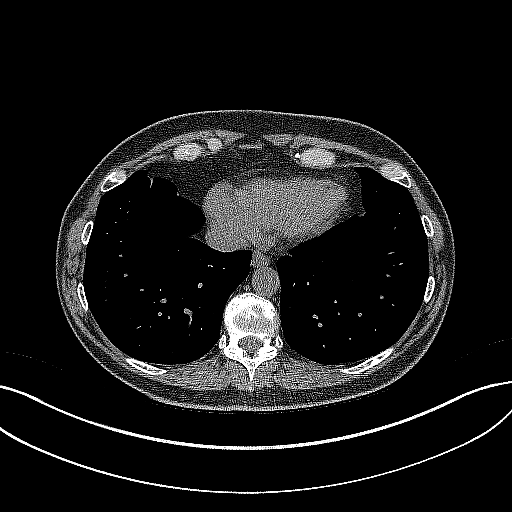
[im 141/369  lung]
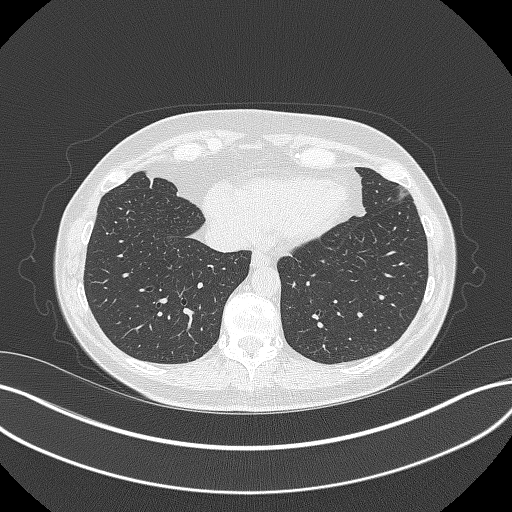
[im 176/369  lung]
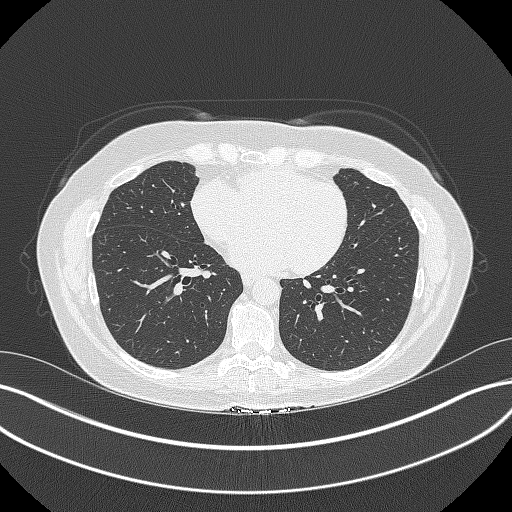
[im 193/369  lung]
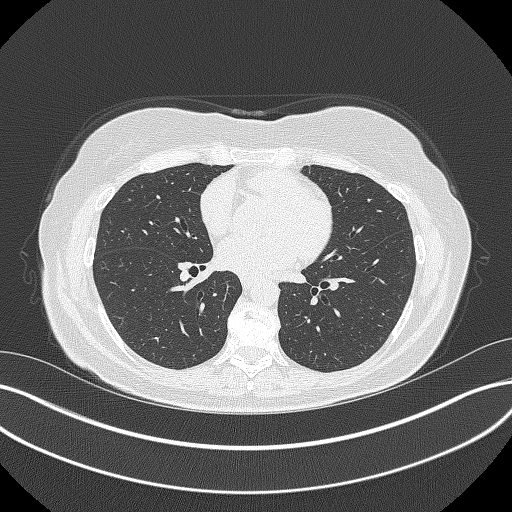
[im 228/369  lung]
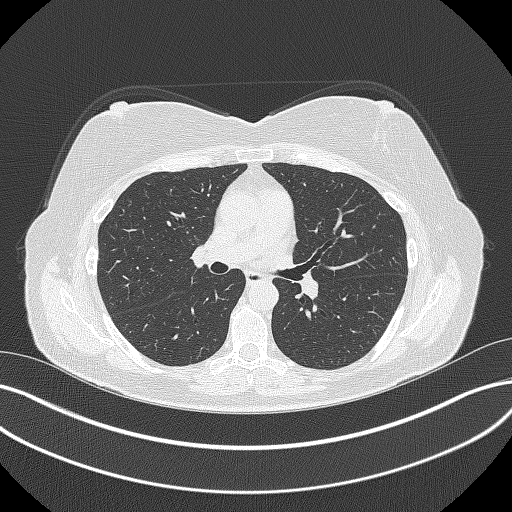
[im 263/369  mediastinal]
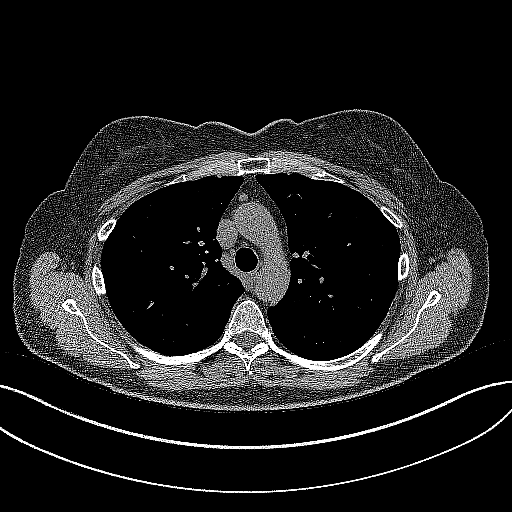
[im 263/369  lung]
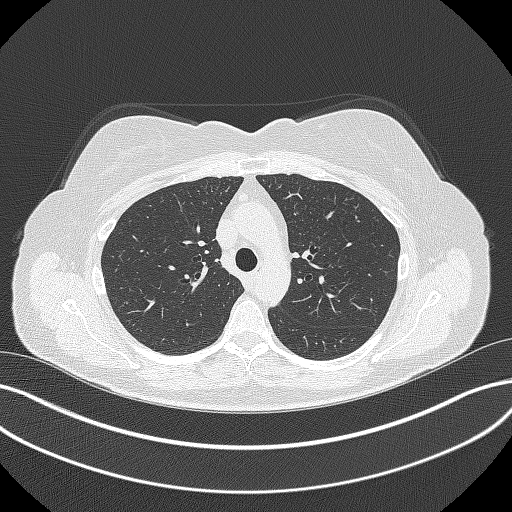
[im 281/369  lung]
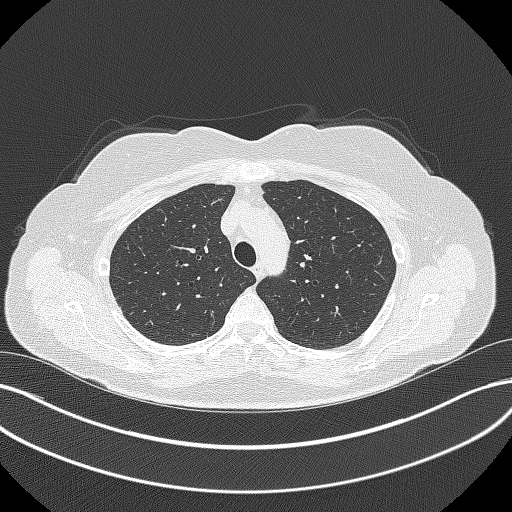
[im 316/369  lung]
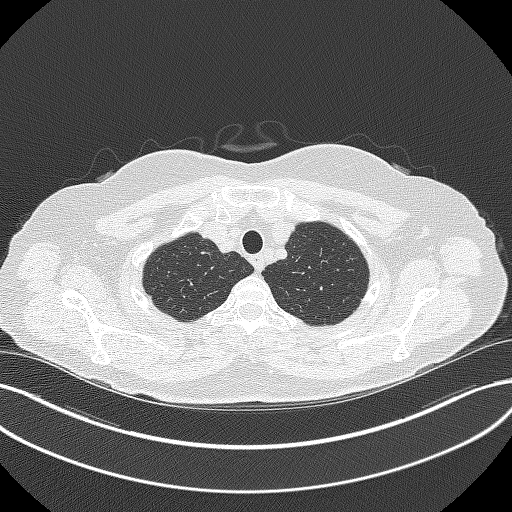
[im 351/369  lung]
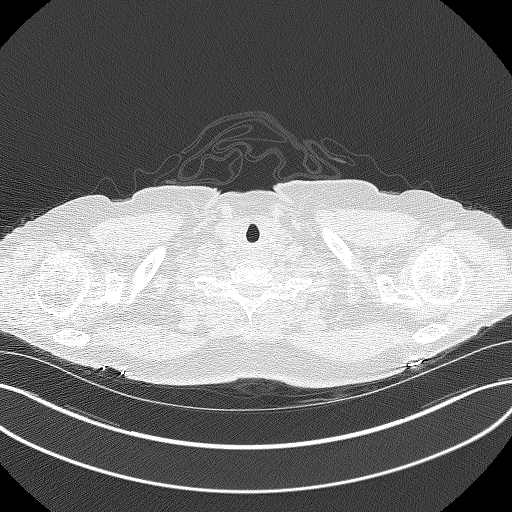

[15 of 40 positions shown; findings below may reference images not displayed]

FINDINGS: Cardiovascular: Normal heart size. No significant pericardial
effusion/thickening. Great vessels are normal in course and caliber.

Mediastinum/Nodes: No discrete thyroid nodules. Unremarkable
esophagus. No pathologically enlarged axillary, mediastinal or hilar
lymph nodes, noting limited sensitivity for the detection of hilar
adenopathy on this noncontrast study.

Lungs/Pleura: No pneumothorax. No pleural effusion. Mild
centrilobular emphysema. No acute consolidative airspace disease or
lung masses. Dominant cavitary nodule in the superior segment right
lower lobe measures 7.3 mm in volume derived mean diameter (series
9/image 169) and is not substantially changed in size since
04/13/2020 screening chest CT, although with questionable minimal
increase in wall thickening. Additional previously visualized
scattered smaller bilateral pulmonary nodules demonstrate no
significant growth. No new significant pulmonary nodules.

Upper abdomen: No acute abnormality.

Musculoskeletal: No aggressive appearing focal osseous lesions. Mild
thoracic spondylosis.
IMPRESSION: 1. Lung-RADS 3, probably benign findings. Short-term follow-up in 6
months is recommended with repeat low-dose chest CT without contrast
(please use the following order, "CT CHEST LCS NODULE FOLLOW-UP W/O
CM"). Dominant 7.3 mm cavitary pulmonary nodule in the superior
segment right lower lobe, not substantially changed in size,
although with questionable minimal increase in wall thickening.
2. Emphysema ([id number]).

## 2023-05-21 DIAGNOSIS — M79641 Pain in right hand: Secondary | ICD-10-CM | POA: Diagnosis not present

## 2023-05-21 DIAGNOSIS — M1991 Primary osteoarthritis, unspecified site: Secondary | ICD-10-CM | POA: Diagnosis not present

## 2023-05-21 DIAGNOSIS — Z6823 Body mass index (BMI) 23.0-23.9, adult: Secondary | ICD-10-CM | POA: Diagnosis not present

## 2023-05-21 DIAGNOSIS — Z79899 Other long term (current) drug therapy: Secondary | ICD-10-CM | POA: Diagnosis not present

## 2023-05-21 DIAGNOSIS — M059 Rheumatoid arthritis with rheumatoid factor, unspecified: Secondary | ICD-10-CM | POA: Diagnosis not present

## 2023-05-21 DIAGNOSIS — M79642 Pain in left hand: Secondary | ICD-10-CM | POA: Diagnosis not present

## 2023-05-29 DIAGNOSIS — M059 Rheumatoid arthritis with rheumatoid factor, unspecified: Secondary | ICD-10-CM | POA: Diagnosis not present

## 2023-06-12 DIAGNOSIS — M059 Rheumatoid arthritis with rheumatoid factor, unspecified: Secondary | ICD-10-CM | POA: Diagnosis not present

## 2023-07-10 DIAGNOSIS — M059 Rheumatoid arthritis with rheumatoid factor, unspecified: Secondary | ICD-10-CM | POA: Diagnosis not present

## 2023-07-26 DIAGNOSIS — H11422 Conjunctival edema, left eye: Secondary | ICD-10-CM | POA: Diagnosis not present

## 2023-08-20 DIAGNOSIS — M25531 Pain in right wrist: Secondary | ICD-10-CM | POA: Diagnosis not present

## 2023-08-20 DIAGNOSIS — M1991 Primary osteoarthritis, unspecified site: Secondary | ICD-10-CM | POA: Diagnosis not present

## 2023-08-20 DIAGNOSIS — M059 Rheumatoid arthritis with rheumatoid factor, unspecified: Secondary | ICD-10-CM | POA: Diagnosis not present

## 2023-08-20 DIAGNOSIS — M25532 Pain in left wrist: Secondary | ICD-10-CM | POA: Diagnosis not present

## 2023-08-20 DIAGNOSIS — Z79899 Other long term (current) drug therapy: Secondary | ICD-10-CM | POA: Diagnosis not present

## 2023-08-20 DIAGNOSIS — Z6823 Body mass index (BMI) 23.0-23.9, adult: Secondary | ICD-10-CM | POA: Diagnosis not present

## 2023-09-04 DIAGNOSIS — M059 Rheumatoid arthritis with rheumatoid factor, unspecified: Secondary | ICD-10-CM | POA: Diagnosis not present

## 2023-10-01 DIAGNOSIS — I700 Atherosclerosis of aorta: Secondary | ICD-10-CM | POA: Diagnosis not present

## 2023-10-01 DIAGNOSIS — Z8041 Family history of malignant neoplasm of ovary: Secondary | ICD-10-CM | POA: Diagnosis not present

## 2023-10-01 DIAGNOSIS — H8110 Benign paroxysmal vertigo, unspecified ear: Secondary | ICD-10-CM | POA: Diagnosis not present

## 2023-10-01 DIAGNOSIS — E785 Hyperlipidemia, unspecified: Secondary | ICD-10-CM | POA: Diagnosis not present

## 2023-10-01 DIAGNOSIS — M8580 Other specified disorders of bone density and structure, unspecified site: Secondary | ICD-10-CM | POA: Diagnosis not present

## 2023-10-01 DIAGNOSIS — R918 Other nonspecific abnormal finding of lung field: Secondary | ICD-10-CM | POA: Diagnosis not present

## 2023-10-01 DIAGNOSIS — R12 Heartburn: Secondary | ICD-10-CM | POA: Diagnosis not present

## 2023-10-01 DIAGNOSIS — M059 Rheumatoid arthritis with rheumatoid factor, unspecified: Secondary | ICD-10-CM | POA: Diagnosis not present

## 2023-10-01 DIAGNOSIS — D7589 Other specified diseases of blood and blood-forming organs: Secondary | ICD-10-CM | POA: Diagnosis not present

## 2023-10-01 DIAGNOSIS — M2550 Pain in unspecified joint: Secondary | ICD-10-CM | POA: Diagnosis not present

## 2023-10-30 DIAGNOSIS — M059 Rheumatoid arthritis with rheumatoid factor, unspecified: Secondary | ICD-10-CM | POA: Diagnosis not present

## 2023-10-30 DIAGNOSIS — Z111 Encounter for screening for respiratory tuberculosis: Secondary | ICD-10-CM | POA: Diagnosis not present

## 2023-10-30 DIAGNOSIS — Z79899 Other long term (current) drug therapy: Secondary | ICD-10-CM | POA: Diagnosis not present

## 2023-10-30 DIAGNOSIS — R5383 Other fatigue: Secondary | ICD-10-CM | POA: Diagnosis not present

## 2023-11-07 DIAGNOSIS — N958 Other specified menopausal and perimenopausal disorders: Secondary | ICD-10-CM | POA: Diagnosis not present

## 2023-11-07 DIAGNOSIS — E2839 Other primary ovarian failure: Secondary | ICD-10-CM | POA: Diagnosis not present

## 2023-11-07 DIAGNOSIS — M8588 Other specified disorders of bone density and structure, other site: Secondary | ICD-10-CM | POA: Diagnosis not present

## 2023-11-07 DIAGNOSIS — M8580 Other specified disorders of bone density and structure, unspecified site: Secondary | ICD-10-CM | POA: Diagnosis not present

## 2023-11-20 DIAGNOSIS — M059 Rheumatoid arthritis with rheumatoid factor, unspecified: Secondary | ICD-10-CM | POA: Diagnosis not present

## 2023-11-20 DIAGNOSIS — Z6822 Body mass index (BMI) 22.0-22.9, adult: Secondary | ICD-10-CM | POA: Diagnosis not present

## 2023-11-20 DIAGNOSIS — R439 Unspecified disturbances of smell and taste: Secondary | ICD-10-CM | POA: Diagnosis not present

## 2023-11-20 DIAGNOSIS — M1991 Primary osteoarthritis, unspecified site: Secondary | ICD-10-CM | POA: Diagnosis not present

## 2023-11-20 DIAGNOSIS — Z79899 Other long term (current) drug therapy: Secondary | ICD-10-CM | POA: Diagnosis not present

## 2023-12-25 DIAGNOSIS — M059 Rheumatoid arthritis with rheumatoid factor, unspecified: Secondary | ICD-10-CM | POA: Diagnosis not present

## 2024-01-27 DIAGNOSIS — R051 Acute cough: Secondary | ICD-10-CM | POA: Diagnosis not present

## 2024-01-27 DIAGNOSIS — J069 Acute upper respiratory infection, unspecified: Secondary | ICD-10-CM | POA: Diagnosis not present

## 2024-01-27 DIAGNOSIS — M059 Rheumatoid arthritis with rheumatoid factor, unspecified: Secondary | ICD-10-CM | POA: Diagnosis not present

## 2024-01-27 DIAGNOSIS — J029 Acute pharyngitis, unspecified: Secondary | ICD-10-CM | POA: Diagnosis not present

## 2024-01-29 DIAGNOSIS — M25512 Pain in left shoulder: Secondary | ICD-10-CM | POA: Diagnosis not present

## 2024-01-29 DIAGNOSIS — M79642 Pain in left hand: Secondary | ICD-10-CM | POA: Diagnosis not present

## 2024-01-29 DIAGNOSIS — Z6822 Body mass index (BMI) 22.0-22.9, adult: Secondary | ICD-10-CM | POA: Diagnosis not present

## 2024-01-29 DIAGNOSIS — M1991 Primary osteoarthritis, unspecified site: Secondary | ICD-10-CM | POA: Diagnosis not present

## 2024-01-29 DIAGNOSIS — Z79899 Other long term (current) drug therapy: Secondary | ICD-10-CM | POA: Diagnosis not present

## 2024-01-29 DIAGNOSIS — M25511 Pain in right shoulder: Secondary | ICD-10-CM | POA: Diagnosis not present

## 2024-01-29 DIAGNOSIS — M059 Rheumatoid arthritis with rheumatoid factor, unspecified: Secondary | ICD-10-CM | POA: Diagnosis not present

## 2024-01-29 DIAGNOSIS — M79641 Pain in right hand: Secondary | ICD-10-CM | POA: Diagnosis not present

## 2024-02-19 DIAGNOSIS — M059 Rheumatoid arthritis with rheumatoid factor, unspecified: Secondary | ICD-10-CM | POA: Diagnosis not present

## 2024-03-04 DIAGNOSIS — Z113 Encounter for screening for infections with a predominantly sexual mode of transmission: Secondary | ICD-10-CM | POA: Diagnosis not present

## 2024-03-04 DIAGNOSIS — Z01419 Encounter for gynecological examination (general) (routine) without abnormal findings: Secondary | ICD-10-CM | POA: Diagnosis not present

## 2024-03-20 DIAGNOSIS — Z6822 Body mass index (BMI) 22.0-22.9, adult: Secondary | ICD-10-CM | POA: Diagnosis not present

## 2024-03-20 DIAGNOSIS — M1991 Primary osteoarthritis, unspecified site: Secondary | ICD-10-CM | POA: Diagnosis not present

## 2024-03-20 DIAGNOSIS — Z79899 Other long term (current) drug therapy: Secondary | ICD-10-CM | POA: Diagnosis not present

## 2024-03-20 DIAGNOSIS — M059 Rheumatoid arthritis with rheumatoid factor, unspecified: Secondary | ICD-10-CM | POA: Diagnosis not present

## 2024-04-01 DIAGNOSIS — M059 Rheumatoid arthritis with rheumatoid factor, unspecified: Secondary | ICD-10-CM | POA: Diagnosis not present

## 2024-04-03 DIAGNOSIS — D7589 Other specified diseases of blood and blood-forming organs: Secondary | ICD-10-CM | POA: Diagnosis not present

## 2024-04-03 DIAGNOSIS — M8580 Other specified disorders of bone density and structure, unspecified site: Secondary | ICD-10-CM | POA: Diagnosis not present

## 2024-04-03 DIAGNOSIS — E785 Hyperlipidemia, unspecified: Secondary | ICD-10-CM | POA: Diagnosis not present

## 2024-04-08 DIAGNOSIS — Z1331 Encounter for screening for depression: Secondary | ICD-10-CM | POA: Diagnosis not present

## 2024-04-08 DIAGNOSIS — J439 Emphysema, unspecified: Secondary | ICD-10-CM | POA: Diagnosis not present

## 2024-04-08 DIAGNOSIS — M8580 Other specified disorders of bone density and structure, unspecified site: Secondary | ICD-10-CM | POA: Diagnosis not present

## 2024-04-08 DIAGNOSIS — R82998 Other abnormal findings in urine: Secondary | ICD-10-CM | POA: Diagnosis not present

## 2024-04-08 DIAGNOSIS — M2550 Pain in unspecified joint: Secondary | ICD-10-CM | POA: Diagnosis not present

## 2024-04-08 DIAGNOSIS — Z8041 Family history of malignant neoplasm of ovary: Secondary | ICD-10-CM | POA: Diagnosis not present

## 2024-04-08 DIAGNOSIS — E785 Hyperlipidemia, unspecified: Secondary | ICD-10-CM | POA: Diagnosis not present

## 2024-04-08 DIAGNOSIS — Z0000 Encounter for general adult medical examination without abnormal findings: Secondary | ICD-10-CM | POA: Diagnosis not present

## 2024-04-08 DIAGNOSIS — M059 Rheumatoid arthritis with rheumatoid factor, unspecified: Secondary | ICD-10-CM | POA: Diagnosis not present

## 2024-04-08 DIAGNOSIS — Z860100 Personal history of colon polyps, unspecified: Secondary | ICD-10-CM | POA: Diagnosis not present

## 2024-04-08 DIAGNOSIS — I700 Atherosclerosis of aorta: Secondary | ICD-10-CM | POA: Diagnosis not present

## 2024-04-08 DIAGNOSIS — Z1339 Encounter for screening examination for other mental health and behavioral disorders: Secondary | ICD-10-CM | POA: Diagnosis not present

## 2024-04-29 DIAGNOSIS — M059 Rheumatoid arthritis with rheumatoid factor, unspecified: Secondary | ICD-10-CM | POA: Diagnosis not present

## 2024-05-18 DIAGNOSIS — H52203 Unspecified astigmatism, bilateral: Secondary | ICD-10-CM | POA: Diagnosis not present

## 2024-05-18 DIAGNOSIS — H2513 Age-related nuclear cataract, bilateral: Secondary | ICD-10-CM | POA: Diagnosis not present

## 2024-05-18 DIAGNOSIS — Z79899 Other long term (current) drug therapy: Secondary | ICD-10-CM | POA: Diagnosis not present

## 2024-06-11 DIAGNOSIS — R062 Wheezing: Secondary | ICD-10-CM | POA: Diagnosis not present

## 2024-06-11 DIAGNOSIS — J189 Pneumonia, unspecified organism: Secondary | ICD-10-CM | POA: Diagnosis not present

## 2024-06-11 DIAGNOSIS — R519 Headache, unspecified: Secondary | ICD-10-CM | POA: Diagnosis not present

## 2024-06-11 DIAGNOSIS — Z1152 Encounter for screening for COVID-19: Secondary | ICD-10-CM | POA: Diagnosis not present

## 2024-06-11 DIAGNOSIS — R0981 Nasal congestion: Secondary | ICD-10-CM | POA: Diagnosis not present

## 2024-06-11 DIAGNOSIS — R5383 Other fatigue: Secondary | ICD-10-CM | POA: Diagnosis not present

## 2024-06-11 DIAGNOSIS — R61 Generalized hyperhidrosis: Secondary | ICD-10-CM | POA: Diagnosis not present

## 2024-06-11 DIAGNOSIS — R058 Other specified cough: Secondary | ICD-10-CM | POA: Diagnosis not present

## 2024-06-23 DIAGNOSIS — M1991 Primary osteoarthritis, unspecified site: Secondary | ICD-10-CM | POA: Diagnosis not present

## 2024-06-23 DIAGNOSIS — M79641 Pain in right hand: Secondary | ICD-10-CM | POA: Diagnosis not present

## 2024-06-23 DIAGNOSIS — Z79899 Other long term (current) drug therapy: Secondary | ICD-10-CM | POA: Diagnosis not present

## 2024-06-23 DIAGNOSIS — M79642 Pain in left hand: Secondary | ICD-10-CM | POA: Diagnosis not present

## 2024-06-23 DIAGNOSIS — M059 Rheumatoid arthritis with rheumatoid factor, unspecified: Secondary | ICD-10-CM | POA: Diagnosis not present

## 2024-06-23 DIAGNOSIS — Z6822 Body mass index (BMI) 22.0-22.9, adult: Secondary | ICD-10-CM | POA: Diagnosis not present

## 2024-06-24 DIAGNOSIS — M059 Rheumatoid arthritis with rheumatoid factor, unspecified: Secondary | ICD-10-CM | POA: Diagnosis not present

## 2024-08-19 DIAGNOSIS — M059 Rheumatoid arthritis with rheumatoid factor, unspecified: Secondary | ICD-10-CM | POA: Diagnosis not present

## 2024-10-02 ENCOUNTER — Other Ambulatory Visit: Payer: Self-pay | Admitting: Internal Medicine

## 2024-10-02 DIAGNOSIS — E785 Hyperlipidemia, unspecified: Secondary | ICD-10-CM

## 2024-10-26 ENCOUNTER — Ambulatory Visit
Admission: RE | Admit: 2024-10-26 | Discharge: 2024-10-26 | Disposition: A | Discharge status: Home / Self Care | Payer: Self-pay | Source: Ambulatory Visit | Attending: Internal Medicine | Admitting: Internal Medicine

## 2024-10-26 DIAGNOSIS — E785 Hyperlipidemia, unspecified: Secondary | ICD-10-CM
